# Patient Record
Sex: Female | Born: 1980
Health system: Southern US, Community
[De-identification: ages and names within clinical notes are randomized; demographics above are authoritative.]

## PROBLEM LIST (undated history)

## (undated) DIAGNOSIS — I10 Essential (primary) hypertension: Secondary | ICD-10-CM

## (undated) DIAGNOSIS — E079 Disorder of thyroid, unspecified: Secondary | ICD-10-CM

## (undated) DIAGNOSIS — T7840XA Allergy, unspecified, initial encounter: Secondary | ICD-10-CM

## (undated) DIAGNOSIS — E0590 Thyrotoxicosis, unspecified without thyrotoxic crisis or storm: Secondary | ICD-10-CM

## (undated) DIAGNOSIS — E119 Type 2 diabetes mellitus without complications: Secondary | ICD-10-CM

## (undated) DIAGNOSIS — D649 Anemia, unspecified: Secondary | ICD-10-CM

## (undated) HISTORY — DX: Disorder of thyroid, unspecified: E07.9

## (undated) HISTORY — DX: Anemia, unspecified: D64.9

## (undated) HISTORY — DX: Allergy, unspecified, initial encounter: T78.40XA

## (undated) HISTORY — PX: KNEE SURGERY: SHX244

## (undated) HISTORY — DX: Type 2 diabetes mellitus without complications: E11.9

---

## 2000-06-07 ENCOUNTER — Emergency Department (HOSPITAL_COMMUNITY): Admission: EM | Admit: 2000-06-07 | Discharge: 2000-06-07 | Payer: Self-pay | Admitting: Emergency Medicine

## 2004-03-29 ENCOUNTER — Other Ambulatory Visit: Admission: RE | Admit: 2004-03-29 | Discharge: 2004-03-29 | Payer: Self-pay | Admitting: Family Medicine

## 2004-09-23 ENCOUNTER — Other Ambulatory Visit: Admission: RE | Admit: 2004-09-23 | Discharge: 2004-09-23 | Payer: Self-pay | Admitting: Obstetrics and Gynecology

## 2005-02-10 ENCOUNTER — Other Ambulatory Visit: Admission: RE | Admit: 2005-02-10 | Discharge: 2005-02-10 | Payer: Self-pay | Admitting: Obstetrics and Gynecology

## 2005-07-01 ENCOUNTER — Other Ambulatory Visit: Admission: RE | Admit: 2005-07-01 | Discharge: 2005-07-01 | Payer: Self-pay | Admitting: Obstetrics and Gynecology

## 2007-08-15 ENCOUNTER — Encounter: Admission: RE | Admit: 2007-08-15 | Discharge: 2007-08-15 | Payer: Self-pay | Admitting: Orthopedic Surgery

## 2007-08-28 ENCOUNTER — Encounter (INDEPENDENT_AMBULATORY_CARE_PROVIDER_SITE_OTHER): Payer: Self-pay | Admitting: Orthopedic Surgery

## 2007-08-28 ENCOUNTER — Ambulatory Visit (HOSPITAL_COMMUNITY): Admission: RE | Admit: 2007-08-28 | Discharge: 2007-08-29 | Payer: Self-pay | Admitting: Orthopedic Surgery

## 2008-05-08 LAB — CONVERTED CEMR LAB: Pap Smear: NORMAL

## 2008-05-21 ENCOUNTER — Encounter: Admission: RE | Admit: 2008-05-21 | Discharge: 2008-05-21 | Payer: Self-pay | Admitting: Orthopedic Surgery

## 2009-05-21 ENCOUNTER — Ambulatory Visit: Payer: Self-pay | Admitting: Family Medicine

## 2009-05-21 DIAGNOSIS — B2790 Infectious mononucleosis, unspecified without complication: Secondary | ICD-10-CM | POA: Insufficient documentation

## 2009-05-21 DIAGNOSIS — Z862 Personal history of diseases of the blood and blood-forming organs and certain disorders involving the immune mechanism: Secondary | ICD-10-CM | POA: Insufficient documentation

## 2009-09-12 ENCOUNTER — Encounter: Admission: RE | Admit: 2009-09-12 | Discharge: 2009-09-12 | Payer: Self-pay | Admitting: Orthopedic Surgery

## 2009-10-20 ENCOUNTER — Encounter (INDEPENDENT_AMBULATORY_CARE_PROVIDER_SITE_OTHER): Payer: Self-pay | Admitting: Orthopedic Surgery

## 2009-10-20 ENCOUNTER — Ambulatory Visit (HOSPITAL_COMMUNITY): Admission: RE | Admit: 2009-10-20 | Discharge: 2009-10-21 | Payer: Self-pay | Admitting: Orthopedic Surgery

## 2010-02-28 ENCOUNTER — Encounter: Payer: Self-pay | Admitting: Orthopedic Surgery
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
## 2010-04-22 LAB — BASIC METABOLIC PANEL
CO2: 23 mEq/L (ref 19–32)
GFR calc non Af Amer: >60 mL/min (ref >60)
Glucose, Bld: 100 mg/dL — ABNORMAL HIGH (ref 70–99)
Sodium: 138 mEq/L (ref 135–145)

## 2010-04-22 LAB — CBC
HCT: 32.6 % — ABNORMAL LOW (ref 36.0–46.0)
Hemoglobin: 10.9 g/dL — ABNORMAL LOW (ref 12.0–15.0)
MCV: 73.9 fL — ABNORMAL LOW (ref 78.0–100.0)
RBC: 4.41 MIL/uL (ref 3.87–5.11)

## 2010-04-22 LAB — DIFFERENTIAL
Eosinophils Absolute: 0.1 10*3/uL (ref 0.0–0.7)
Eosinophils Relative: 2 % (ref 0–5)
Monocytes Absolute: 0.5 10*3/uL (ref 0.1–1.0)

## 2010-04-22 LAB — PROTIME-INR: Prothrombin Time: 12.8 seconds (ref 11.6–15.2)

## 2010-04-22 LAB — SURGICAL PCR SCREEN
MRSA, PCR: NEGATIVE
Staphylococcus aureus: NEGATIVE

## 2010-04-22 LAB — TYPE AND SCREEN: Antibody Screen: NEGATIVE

## 2010-06-07 ENCOUNTER — Other Ambulatory Visit: Payer: Self-pay | Admitting: Obstetrics & Gynecology

## 2010-06-22 NOTE — Op Note (Signed)
NAME:  Misty Salazar, GROSECLOSE NO.:  [id number]   MEDICAL RECORD NO.:  [id number]          PATIENT TYPE:  OIB   LOCATION:  5021                         FACILITY:  [facility]   PHYSICIAN:  Burnard Bunting, M.D.    DATE OF BIRTH:  [birth year]   DATE OF PROCEDURE:  08/28/2007  DATE OF DISCHARGE:  08/29/2007                               OPERATIVE REPORT   PREOPERATIVE DIAGNOSIS:  Right knee pigmented villonodular synovitis,  retropatellar space.   POSTOPERATIVE DIAGNOSIS:  Right knee pigmented villonodular synovitis,  retropatellar space.   PROCEDURE:  Right knee pigmented villonodular synovitis removal.   SURGEON:  Burnard Bunting, MD   ASSISTANT:  None.   ANESTHESIA:  General endotracheal.   ESTIMATED BLOOD LOSS:  Minimal.   INDICATIONS:  [name] is a 30 year old female with right knee pain  and a nodular PVNS by MRI scan and she presents now for operative  management after explanation of risks and benefits.   SPECIMENS:  Fat pads with nodular PVNS x1.   PROCEDURE IN DETAIL:  The patient was brought to the operating room  where general endotracheal anesthesia was induced.  Preoperative  antibiotics administered, the right knee and leg was prepped with  DuraPrep solution and draped in sterile manner.  Operative field was  scrubbed with Ioban.  The leg was elevated, exsanguinated with an  Esmarch wrap, and tourniquet was inflated.  Incision was made on the  medial aspect of the patellar tendon, extending proximally.  Skin and  subcutaneous tissue were sharply divided.  A catheter was entered.  Careful dissection was performed to eliminate a plane between the  posterior aspect of the patellar tendon and fat pad.  Fat pad was then  carefully and completely excised from the retropatellar space.  Care was  taken to avoid injury to the transverse meniscal ligament.  An  arthrotomy was also made on the lateral aspect of the patellar tendon to  facilitate full removal.  The  fat pad was removed in block.  The nodular  masses were fully within the fat pad itself.  Knee joint was thoroughly  irrigated.  The menisci were intact.  Following the thorough irrigation,  the tourniquet was released.  Bleeding points encountered were  controlled using electrocautery.  The arthrotomy was then closed using  interrupted inverted 0 Vicryl sutures followed by interrupted inverted 3-  0 Vicryl sutures and running 3-0 Prolene.  The patient tolerated the  procedure without immediate complications.  Solution of Marcaine,  morphine, and clonidine was injected to the knee.  Bulky dressing was  applied.  Specimen was sent to pathology.      Burnard Bunting, M.D.  Electronically Signed     GSD/MEDQ  D:  09/06/2007  T:  09/07/2007  Job:  [id number]

## 2010-11-05 LAB — DIFFERENTIAL
Basophils Absolute: 0
Eosinophils Absolute: 0.1
Lymphocytes Relative: 33
Monocytes Absolute: 0.4
Monocytes Relative: 5
Neutro Abs: 4.2
Neutrophils Relative %: 60

## 2010-11-05 LAB — CBC
Hemoglobin: 11.6 — ABNORMAL LOW
MCHC: 33.5
MCV: 80
Platelets: 278

## 2010-11-05 LAB — BASIC METABOLIC PANEL
Calcium: 9.5
GFR calc Af Amer: >60
GFR calc non Af Amer: >60

## 2010-11-05 LAB — ABO/RH: ABO/RH(D): O POS

## 2010-11-05 LAB — PROTIME-INR: INR: 1

## 2010-11-05 LAB — TYPE AND SCREEN
ABO/RH(D): O POS
Antibody Screen: NEGATIVE

## 2010-11-25 ENCOUNTER — Other Ambulatory Visit: Payer: Self-pay | Admitting: Family Medicine

## 2010-11-25 ENCOUNTER — Ambulatory Visit
Admission: RE | Admit: 2010-11-25 | Discharge: 2010-11-25 | Disposition: A | Discharge status: Home / Self Care | Payer: Medicare HMO | Source: Ambulatory Visit | Attending: Family Medicine | Admitting: Family Medicine

## 2010-11-25 DIAGNOSIS — R1011 Right upper quadrant pain: Secondary | ICD-10-CM

## 2010-12-02 ENCOUNTER — Ambulatory Visit (INDEPENDENT_AMBULATORY_CARE_PROVIDER_SITE_OTHER): Payer: Medicare HMO | Admitting: General Surgery

## 2010-12-02 ENCOUNTER — Encounter (INDEPENDENT_AMBULATORY_CARE_PROVIDER_SITE_OTHER): Payer: Self-pay | Admitting: General Surgery

## 2010-12-02 VITALS — BP 154/98 | HR 72 | Temp 97.8°F | Resp 16 | Ht 62.0 in | Wt 279.4 lb

## 2010-12-02 DIAGNOSIS — K8020 Calculus of gallbladder without cholecystitis without obstruction: Secondary | ICD-10-CM

## 2010-12-02 NOTE — Patient Instructions (Signed)
Call me back tomorrow at [phone]

## 2010-12-02 NOTE — Progress Notes (Signed)
Chief Complaint  Patient presents with  . Other    new pt- eval gallstones    HPI Misty Salazar is a 30 y.o. female.This patient is referred by Dr. Milus Glazier for evaluation of possible cholelithiasis. She was seen approximately 8 days ago in the urgent care for evaluation of right upper quadrant pain which awoke her from sleep. She states that she had pain in the right upper quadrant after eating pizza which lasts a few hours and spontaneously resolved. She describes this as a sharp pain  which was the worst pain that she had felt. She denies any radiation. There is no associated fevers, chills, nausea, or vomiting. She states that she had a similar episode although not as severe a few days prior to her presentation at the urgent care but she has not had any episodes since. She did not have heartburn but she was given Nexium for possible relief as well. She has not noticed any difference but she has not had any symptoms since then. She has been trying to eat a low-fat diet since both of these episodes were brought about by pizza and fried chicken from [employer]. HPI  Past Medical History  Diagnosis Date  . Anemia   . Thyroid disease     hyperthyroidism    Past Surgical History  Procedure Date  . Knee surgery 08/2007, 10/2009    right knee- remove fibroma    Family History  Problem Relation Age of Onset  . Cancer Maternal Grandmother     cervical  . Stroke Maternal Grandmother   . Stroke Maternal Grandfather   . Stroke Paternal Grandfather     Social History History  Substance Use Topics  . Smoking status: Never Smoker   . Smokeless tobacco: Not on file  . Alcohol Use: Yes    Allergies  Allergen Reactions  . Percocet (Oxycodone-Acetaminophen)     Current Outpatient Prescriptions  Medication Sig Dispense Refill  . IRON PO Take by mouth daily.        Colleen Can 1/20 1-20 MG-MCG tablet daily.      . methimazole (TAPAZOLE) 10 MG tablet daily.        Review of  Systems Review of Systems  All other systems reviewed and are negative.    Blood pressure 154/98, pulse 72, temperature 97.8 F (36.6 C), temperature source Temporal, resp. rate 16, height 5\' 2"  (1.575 m), weight 279 lb 6.4 oz (126.735 kg).  Physical Exam Physical Exam  Constitutional: She is oriented to person, place, and time. She appears well-developed and well-nourished. No distress.  HENT:  Head: Normocephalic and atraumatic.  Mouth/Throat: Oropharyngeal exudate present.  Eyes: Conjunctivae and EOM are normal. Pupils are equal, round, and reactive to light. Right eye exhibits no discharge. Left eye exhibits no discharge. No scleral icterus.  Neck: Normal range of motion. Neck supple. No tracheal deviation present.  Cardiovascular: Normal rate, regular rhythm and normal heart sounds.   Pulmonary/Chest: Effort normal and breath sounds normal. No stridor. No respiratory distress. She has no wheezes.  Abdominal: Soft. Bowel sounds are normal. She exhibits no distension and no mass. There is no tenderness. There is no rebound and no guarding.  Musculoskeletal: Normal range of motion. She exhibits no edema and no tenderness.  Neurological: She is alert and oriented to person, place, and time.  Skin: Skin is warm and dry. No rash noted. She is not diaphoretic. No erythema. No pallor.  Psychiatric: She has a normal mood and affect. Her  behavior is normal. Judgment and thought content normal.    Data Reviewed [place] Assessment    Abdominal pain and possible cholelithiasis. I think that her symptoms do sound consistent with gallbladder pathology and possible symptomatic cholelithiasis.However her ultrasound was not very convincing for cholelithiasis. I have placed a call to Dr. Reche Dixon selection reviewed the images with him and confirm cholelithiasis. If she does indeed have gallstones on ultrasound, and given her symptoms I would recommend cholecystectomy for treatment. I discussed with her the  options of continued observation versus surgical management to prevent further episodes of pain. I think that she is leaning towards surgery. We discussed the surgery and its risks including infection, bleeding, pain, scarring, persistent symptoms, injury to bowel or bile ducts, diarrhea, and need for open surgery and she expressed understanding of these risks.     Plan    I have placed a call with Dr. Reche Dixon the radiologist to review her images and if she does have gallstones then we will proceed with cholecystectomy given her symptoms. She will call me tomorrow to followup for discussion and to schedule surgery.       Lodema Pilot DAVID 12/02/2010, 3:23 PM

## 2010-12-06 ENCOUNTER — Other Ambulatory Visit (INDEPENDENT_AMBULATORY_CARE_PROVIDER_SITE_OTHER): Payer: Self-pay | Admitting: General Surgery

## 2010-12-06 DIAGNOSIS — Z711 Person with feared health complaint in whom no diagnosis is made: Secondary | ICD-10-CM

## 2010-12-10 ENCOUNTER — Ambulatory Visit
Admission: RE | Admit: 2010-12-10 | Discharge: 2010-12-10 | Disposition: A | Discharge status: Home / Self Care | Payer: 59 | Source: Ambulatory Visit | Attending: General Surgery | Admitting: General Surgery

## 2010-12-10 DIAGNOSIS — Z711 Person with feared health complaint in whom no diagnosis is made: Secondary | ICD-10-CM

## 2010-12-10 MED ORDER — GADOBENATE DIMEGLUMINE 529 MG/ML IV SOLN
20.0000 mL | Freq: Once | INTRAVENOUS | Status: AC | PRN
  Administered 2010-12-10: 20 mL via INTRAVENOUS

## 2010-12-14 ENCOUNTER — Telehealth (INDEPENDENT_AMBULATORY_CARE_PROVIDER_SITE_OTHER): Payer: Self-pay

## 2010-12-14 ENCOUNTER — Other Ambulatory Visit (INDEPENDENT_AMBULATORY_CARE_PROVIDER_SITE_OTHER): Payer: Self-pay | Admitting: General Surgery

## 2010-12-14 NOTE — Telephone Encounter (Signed)
Called patient to discuss MRI results, patient would like to schedule surgery (Laparoscopic Cholecystectomy) the 1st week of January 2013, preferably on a Wed or Thursday.  Surgical orders given to schedulers, also discussed medications and vitamins to hold prior to surgery, patient aware and understands.

## 2010-12-17 ENCOUNTER — Other Ambulatory Visit (INDEPENDENT_AMBULATORY_CARE_PROVIDER_SITE_OTHER): Payer: Self-pay | Admitting: General Surgery

## 2010-12-23 ENCOUNTER — Other Ambulatory Visit (INDEPENDENT_AMBULATORY_CARE_PROVIDER_SITE_OTHER): Payer: Self-pay

## 2010-12-24 ENCOUNTER — Other Ambulatory Visit (INDEPENDENT_AMBULATORY_CARE_PROVIDER_SITE_OTHER): Payer: Self-pay

## 2011-01-13 ENCOUNTER — Encounter (HOSPITAL_COMMUNITY): Payer: Self-pay | Admitting: Pharmacy Technician

## 2011-01-17 ENCOUNTER — Encounter (HOSPITAL_COMMUNITY)
Admission: RE | Admit: 2011-01-17 | Discharge: 2011-01-17 | Disposition: A | Discharge status: Home / Self Care | Payer: Managed Care, Other (non HMO) | Source: Ambulatory Visit | Attending: General Surgery | Admitting: General Surgery

## 2011-01-17 ENCOUNTER — Encounter (HOSPITAL_COMMUNITY): Payer: Self-pay

## 2011-01-17 HISTORY — DX: Thyrotoxicosis, unspecified without thyrotoxic crisis or storm: E05.90

## 2011-01-17 LAB — COMPREHENSIVE METABOLIC PANEL
AST: 13 U/L (ref 0–37)
BUN: 7 mg/dL (ref 6–23)
CO2: 24 mEq/L (ref 19–32)
Chloride: 103 mEq/L (ref 96–112)
Creatinine, Ser: 0.77 mg/dL (ref 0.50–1.10)
GFR calc Af Amer: >90 mL/min (ref >90)
GFR calc non Af Amer: >90 mL/min (ref >90)
Glucose, Bld: 81 mg/dL (ref 70–99)
Total Bilirubin: 0.2 mg/dL — ABNORMAL LOW (ref 0.3–1.2)

## 2011-01-17 LAB — SURGICAL PCR SCREEN: MRSA, PCR: NEGATIVE

## 2011-01-17 LAB — DIFFERENTIAL
Basophils Absolute: 0 10*3/uL (ref 0.0–0.1)
Basophils Relative: 0 % (ref 0–1)
Monocytes Absolute: 0.5 10*3/uL (ref 0.1–1.0)
Neutro Abs: 5.7 10*3/uL (ref 1.7–7.7)
Neutrophils Relative %: 59 % (ref 43–77)

## 2011-01-17 LAB — CBC
HCT: 34.1 % — ABNORMAL LOW (ref 36.0–46.0)
Hemoglobin: 11.4 g/dL — ABNORMAL LOW (ref 12.0–15.0)
MCH: 25.3 pg — ABNORMAL LOW (ref 26.0–34.0)
MCHC: 33.4 g/dL (ref 30.0–36.0)

## 2011-01-17 LAB — HCG, SERUM, QUALITATIVE: Preg, Serum: NEGATIVE

## 2011-01-17 NOTE — Patient Instructions (Signed)
20 Misty Salazar  01/17/2011   Your procedure is scheduled on:  01/20/11 1200noon-201 pm   Report to [facility] at 1000 AM.  Call this number if you have problems the morning of surgery: [phone]   Remember:   Do not eat food:After Midnight.  May have clear liquids:until Midnight .  Clear liquids include soda, tea, black coffee, apple or grape juice, broth.  Take these medicines the morning of surgery with A SIP OF WATER:    Do not wear jewelry, make-up or nail polish.  Do not wear lotions, powders, or perfumes.  Do not shave 48 hours prior to surgery.  Do not bring valuables to the hospital.  Contacts, dentures or bridgework may not be worn into surgery.      Patients discharged the day of surgery will not be allowed to drive home.  Name and phone number of your driver:   Special Instructions: CHG Shower Use Special Wash: 1/2 bottle night before surgery and 1/2 bottle morning of surgery. Shower chin to toes with CHG. Wash face and private parts with regular soap.    Please read over the following fact sheets that you were given: MRSA Information, coughing and deep breathing exercises, leg exercises

## 2011-01-20 ENCOUNTER — Encounter (HOSPITAL_COMMUNITY): Payer: Self-pay | Admitting: Anesthesiology

## 2011-01-20 ENCOUNTER — Encounter (HOSPITAL_COMMUNITY)
Admission: RE | Disposition: A | Discharge status: Home / Self Care | Payer: Self-pay | Source: Ambulatory Visit | Attending: General Surgery

## 2011-01-20 ENCOUNTER — Ambulatory Visit (HOSPITAL_COMMUNITY): Payer: Managed Care, Other (non HMO) | Admitting: Anesthesiology

## 2011-01-20 ENCOUNTER — Other Ambulatory Visit (INDEPENDENT_AMBULATORY_CARE_PROVIDER_SITE_OTHER): Payer: Self-pay | Admitting: General Surgery

## 2011-01-20 ENCOUNTER — Ambulatory Visit (HOSPITAL_COMMUNITY)
Admission: RE | Admit: 2011-01-20 | Discharge: 2011-01-20 | Disposition: A | Discharge status: Home / Self Care | Payer: Managed Care, Other (non HMO) | Source: Ambulatory Visit | Attending: General Surgery | Admitting: General Surgery

## 2011-01-20 ENCOUNTER — Encounter (HOSPITAL_COMMUNITY): Payer: Self-pay | Admitting: *Deleted

## 2011-01-20 DIAGNOSIS — Z79899 Other long term (current) drug therapy: Secondary | ICD-10-CM | POA: Insufficient documentation

## 2011-01-20 DIAGNOSIS — E0590 Thyrotoxicosis, unspecified without thyrotoxic crisis or storm: Secondary | ICD-10-CM | POA: Insufficient documentation

## 2011-01-20 DIAGNOSIS — Z01812 Encounter for preprocedural laboratory examination: Secondary | ICD-10-CM | POA: Insufficient documentation

## 2011-01-20 DIAGNOSIS — D649 Anemia, unspecified: Secondary | ICD-10-CM | POA: Insufficient documentation

## 2011-01-20 DIAGNOSIS — K8010 Calculus of gallbladder with chronic cholecystitis without obstruction: Secondary | ICD-10-CM

## 2011-01-20 DIAGNOSIS — K8020 Calculus of gallbladder without cholecystitis without obstruction: Secondary | ICD-10-CM | POA: Insufficient documentation

## 2011-01-20 HISTORY — PX: CHOLECYSTECTOMY: SHX55

## 2011-01-20 SURGERY — LAPAROSCOPIC CHOLECYSTECTOMY
Anesthesia: General | Site: Abdomen | Wound class: Contaminated

## 2011-01-20 MED ORDER — BUPIVACAINE-EPINEPHRINE PF 0.25-1:200000 % IJ SOLN
INTRAMUSCULAR | Status: AC
  Filled 2011-01-20: qty 30

## 2011-01-20 MED ORDER — LACTATED RINGERS IV SOLN
INTRAVENOUS | Status: DC | PRN
  Administered 2011-01-20: 1000 mL

## 2011-01-20 MED ORDER — HYDROCODONE-ACETAMINOPHEN 5-325 MG PO TABS
1.0000 | ORAL_TABLET | ORAL | Status: DC | PRN
Start: 2011-01-20 — End: 2011-01-22
  Administered 2011-01-20: 1 via ORAL

## 2011-01-20 MED ORDER — GLYCOPYRROLATE 0.2 MG/ML IJ SOLN
INTRAMUSCULAR | Status: DC | PRN
  Administered 2011-01-20: .7 mg via INTRAVENOUS

## 2011-01-20 MED ORDER — SUCCINYLCHOLINE CHLORIDE 20 MG/ML IJ SOLN
INTRAMUSCULAR | Status: DC | PRN
  Administered 2011-01-20: 100 mg via INTRAVENOUS

## 2011-01-20 MED ORDER — LACTATED RINGERS IV SOLN
INTRAVENOUS | Status: DC
  Administered 2011-01-20: 14:00:00 via INTRAVENOUS
  Administered 2011-01-20: 1000 mL via INTRAVENOUS

## 2011-01-20 MED ORDER — HYDROCODONE-ACETAMINOPHEN 5-500 MG PO TABS: 1.0000 | ORAL_TABLET | Freq: Four times a day (QID) | ORAL | Status: AC | PRN

## 2011-01-20 MED ORDER — CISATRACURIUM BESYLATE 2 MG/ML IV SOLN
INTRAVENOUS | Status: DC | PRN
  Administered 2011-01-20 (×3): 2 mg via INTRAVENOUS
  Administered 2011-01-20: 4 mg via INTRAVENOUS
  Administered 2011-01-20: 6 mg via INTRAVENOUS

## 2011-01-20 MED ORDER — ACETAMINOPHEN 10 MG/ML IV SOLN
INTRAVENOUS | Status: AC
  Filled 2011-01-20: qty 100

## 2011-01-20 MED ORDER — CEFAZOLIN SODIUM-DEXTROSE 2-3 GM-% IV SOLR
2.0000 g | INTRAVENOUS | Status: AC
  Administered 2011-01-20: 2 g via INTRAVENOUS

## 2011-01-20 MED ORDER — LABETALOL HCL 5 MG/ML IV SOLN
INTRAVENOUS | Status: DC | PRN
  Administered 2011-01-20 (×4): 5 mg via INTRAVENOUS

## 2011-01-20 MED ORDER — FENTANYL CITRATE 0.05 MG/ML IJ SOLN
25.0000 ug | INTRAMUSCULAR | Status: DC | PRN
  Administered 2011-01-20: 25 ug via INTRAVENOUS

## 2011-01-20 MED ORDER — PROMETHAZINE HCL 25 MG/ML IJ SOLN: 6.2500 mg | INTRAMUSCULAR | Status: DC | PRN

## 2011-01-20 MED ORDER — MIDAZOLAM HCL 5 MG/5ML IJ SOLN
INTRAMUSCULAR | Status: DC | PRN
  Administered 2011-01-20 (×2): 1 mg via INTRAVENOUS

## 2011-01-20 MED ORDER — ONDANSETRON HCL 4 MG/2ML IJ SOLN
INTRAMUSCULAR | Status: DC | PRN
  Administered 2011-01-20: 4 mg via INTRAVENOUS

## 2011-01-20 MED ORDER — LIDOCAINE HCL (CARDIAC) 20 MG/ML IV SOLN
INTRAVENOUS | Status: DC | PRN
  Administered 2011-01-20: 60 mg via INTRAVENOUS

## 2011-01-20 MED ORDER — HYDROCODONE-ACETAMINOPHEN 5-325 MG PO TABS
ORAL_TABLET | ORAL | Status: AC
  Administered 2011-01-20: 1 via ORAL
  Filled 2011-01-20: qty 1

## 2011-01-20 MED ORDER — NEOSTIGMINE METHYLSULFATE 1 MG/ML IJ SOLN
INTRAMUSCULAR | Status: DC | PRN
  Administered 2011-01-20: 5 mg via INTRAVENOUS

## 2011-01-20 MED ORDER — IOHEXOL 300 MG/ML  SOLN
INTRAMUSCULAR | Status: AC
  Filled 2011-01-20: qty 1

## 2011-01-20 MED ORDER — BUPIVACAINE-EPINEPHRINE 0.25% -1:200000 IJ SOLN
INTRAMUSCULAR | Status: DC | PRN
  Administered 2011-01-20: 30 mL

## 2011-01-20 MED ORDER — PROPOFOL 10 MG/ML IV BOLUS
INTRAVENOUS | Status: DC | PRN
  Administered 2011-01-20: 20 mg via INTRAVENOUS
  Administered 2011-01-20: 180 mg via INTRAVENOUS

## 2011-01-20 MED ORDER — CEFAZOLIN SODIUM-DEXTROSE 2-3 GM-% IV SOLR
INTRAVENOUS | Status: AC
  Filled 2011-01-20: qty 50

## 2011-01-20 MED ORDER — ACETAMINOPHEN 10 MG/ML IV SOLN
INTRAVENOUS | Status: DC | PRN
  Administered 2011-01-20: 1000 mg via INTRAVENOUS

## 2011-01-20 MED ORDER — LACTATED RINGERS IV SOLN: INTRAVENOUS | Status: DC

## 2011-01-20 MED ORDER — FENTANYL CITRATE 0.05 MG/ML IJ SOLN
INTRAMUSCULAR | Status: AC
  Filled 2011-01-20: qty 2

## 2011-01-20 MED ORDER — FENTANYL CITRATE 0.05 MG/ML IJ SOLN
INTRAMUSCULAR | Status: DC | PRN
Start: 2011-01-20 — End: 2011-01-20
  Administered 2011-01-20 (×3): 50 ug via INTRAVENOUS
  Administered 2011-01-20: 150 ug via INTRAVENOUS

## 2011-01-20 SURGICAL SUPPLY — 52 items
ADH SKN CLS APL DERMABOND .7 (GAUZE/BANDAGES/DRESSINGS)
APPLICATOR COTTON TIP 6IN STRL (MISCELLANEOUS) IMPLANT
APPLIER CLIP LOGIC TI 5 (MISCELLANEOUS) IMPLANT
APPLIER CLIP ROT 10 11.4 M/L (STAPLE) ×3
APR CLP MED LRG 11.4X10 (STAPLE) ×2
APR CLP MED LRG 33X5 (MISCELLANEOUS)
BAG SPEC RTRVL LRG 6X4 10 (ENDOMECHANICALS) ×2
CANISTER SUCTION 2500CC (MISCELLANEOUS) ×3 IMPLANT
CHLORAPREP W/TINT 26ML (MISCELLANEOUS) ×3 IMPLANT
CLIP APPLIE ROT 10 11.4 M/L (STAPLE) ×1 IMPLANT
CLOTH BEACON ORANGE TIMEOUT ST (SAFETY) ×3 IMPLANT
COVER MAYO STAND STRL (DRAPES) ×2 IMPLANT
DECANTER SPIKE VIAL GLASS SM (MISCELLANEOUS) ×3 IMPLANT
DERMABOND ADVANCED (GAUZE/BANDAGES/DRESSINGS)
DERMABOND ADVANCED .7 DNX12 (GAUZE/BANDAGES/DRESSINGS) IMPLANT
DRAPE C-ARM 42X72 X-RAY (DRAPES) IMPLANT
DRAPE CAMERA CLOSED 9X96 (DRAPES) IMPLANT
DRAPE LAPAROSCOPIC ABDOMINAL (DRAPES) ×3 IMPLANT
DRAPE WARM FLUID 44X44 (DRAPE) ×1 IMPLANT
ELECT REM PT RETURN 9FT ADLT (ELECTROSURGICAL) ×3
ELECTRODE REM PT RTRN 9FT ADLT (ELECTROSURGICAL) ×2 IMPLANT
ENDOLOOP SUT PDS II  0 18 (SUTURE) ×2
ENDOLOOP SUT PDS II 0 18 (SUTURE) ×2 IMPLANT
GLOVE BIOGEL PI IND STRL 6.5 (GLOVE) ×1 IMPLANT
GLOVE BIOGEL PI IND STRL 7.0 (GLOVE) ×2 IMPLANT
GLOVE BIOGEL PI INDICATOR 6.5 (GLOVE) ×1
GLOVE BIOGEL PI INDICATOR 7.0 (GLOVE) ×1
GLOVE ECLIPSE 7.0 STRL STRAW (GLOVE) ×3 IMPLANT
GLOVE SURG SS PI 7.5 STRL IVOR (GLOVE) ×6 IMPLANT
GOWN STRL NON-REIN LRG LVL3 (GOWN DISPOSABLE) ×7 IMPLANT
GOWN STRL REIN XL XLG (GOWN DISPOSABLE) ×6 IMPLANT
KIT BASIN OR (CUSTOM PROCEDURE TRAY) ×3 IMPLANT
NDL INSUFFLATION 14GA 120MM (NEEDLE) IMPLANT
NEEDLE INSUFFLATION 14GA 120MM (NEEDLE) IMPLANT
NS IRRIG 1000ML POUR BTL (IV SOLUTION) ×3 IMPLANT
POUCH SPECIMEN RETRIEVAL 10MM (ENDOMECHANICALS) ×3 IMPLANT
SCISSORS LAP 5X35 DISP (ENDOMECHANICALS) IMPLANT
SET CHOLANGIOGRAPH MIX (MISCELLANEOUS) IMPLANT
SET IRRIG TUBING LAPAROSCOPIC (IRRIGATION / IRRIGATOR) IMPLANT
SLEEVE ENDOPATH XCEL 5M (ENDOMECHANICALS) ×2 IMPLANT
SLEEVE Z-THREAD 5X100MM (TROCAR) ×6 IMPLANT
STRIP CLOSURE SKIN 1/2X4 (GAUZE/BANDAGES/DRESSINGS) IMPLANT
SUT MNCRL AB 4-0 PS2 18 (SUTURE) ×3 IMPLANT
TOWEL OR 17X26 10 PK STRL BLUE (TOWEL DISPOSABLE) ×3 IMPLANT
TRAY LAP CHOLE (CUSTOM PROCEDURE TRAY) ×3 IMPLANT
TROCAR BALLN 12MMX100 BLUNT (TROCAR) ×3 IMPLANT
TROCAR BLADELESS OPT 5 100 (ENDOMECHANICALS) ×2 IMPLANT
TROCAR XCEL BLUNT TIP 100MML (ENDOMECHANICALS) IMPLANT
TROCAR XCEL NON-BLD 11X100MML (ENDOMECHANICALS) ×2 IMPLANT
TROCAR Z-THREAD FIOS 11X100 BL (TROCAR) ×1 IMPLANT
TROCAR Z-THREAD FIOS 5X100MM (TROCAR) ×1 IMPLANT
TUBING INSUFFLATION 10FT LAP (TUBING) ×3 IMPLANT

## 2011-01-20 NOTE — Transfer of Care (Signed)
Immediate Anesthesia Transfer of Care Note  Patient: Misty Salazar  Procedure(s) Performed:  LAPAROSCOPIC CHOLECYSTECTOMY  Patient Location: PACU  Anesthesia Type: General  Level of Consciousness: sedated, patient cooperative and responds to stimulaton  Airway & Oxygen Therapy: Patient Spontanous Breathing and Patient connected to face mask oxgen  Post-op Assessment: Report given to PACU RN and Post -op Vital signs reviewed and stable  Post vital signs: Reviewed and stable  Complications: No apparent anesthesia complications

## 2011-01-20 NOTE — Anesthesia Procedure Notes (Signed)
Date/Time: 01/20/2011 1:20 PM Performed by: Hulan Fess Pre-anesthesia Checklist: Patient identified, Emergency Drugs available, Suction available, Patient being monitored and Timeout performed Patient Re-evaluated:Patient Re-evaluated prior to inductionOxygen Delivery Method: Circle System Utilized Preoxygenation: Pre-oxygenation with 100% oxygen Intubation Type: IV induction Ventilation: Mask ventilation without difficulty Laryngoscope Size: Mac and 3 Grade View: Grade I Tube type: Oral Tube size: 8.0 mm Number of attempts: 1 Placement Confirmation: ETT inserted through vocal cords under direct vision,  positive ETCO2 and breath sounds checked- equal and bilateral Secured at: 22 cm Tube secured with: Tape Dental Injury: Teeth and Oropharynx as per pre-operative assessment

## 2011-01-20 NOTE — Brief Op Note (Signed)
01/20/2011  4:06 PM  PATIENT:  Misty Salazar  30 y.o. female  PRE-OPERATIVE DIAGNOSIS:  CHOLELITHIASIS  POST-OPERATIVE DIAGNOSIS:  CHOLELITHIASIS  PROCEDURE:  Procedure(s): LAPAROSCOPIC CHOLECYSTECTOMY  SURGEON:  Surgeon(s): Rulon Abide, DO  PHYSICIAN ASSISTANT:   ASSISTANTS: none, intraoperative consult by Dr. Andrey Campanile  Findings:  Giant liver and thick abdominal wall limiting ability to retract gallbladder, gallbladder dissected dome down and GB hanging on cystic duct and endolooped x2.  Multiple gallstones and large stone extracted from cystic duct. Unable to perform cholangiogram  ANESTHESIA:   general  EBL:  Total I/O In: 1000 [I.V.:1000] Out: -   BLOOD ADMINISTERED:none  DRAINS: none   LOCAL MEDICATIONS USED:  MARCAINE 15CC and LIDOCAINE 15CC  SPECIMEN:  Source of Specimen:  gallbladder and contents  DISPOSITION OF SPECIMEN:  PATHOLOGY  COUNTS:  YES  TOURNIQUET:  * No tourniquets in log *  DICTATION: .Other Dictation: Dictation Number [id number]   PLAN OF CARE: Discharge to home after PACU  PATIENT DISPOSITION:  PACU - hemodynamically stable.   Delay start of Pharmacological VTE agent (>24hrs) due to surgical blood loss or risk of bleeding:  {YES/NO/NOT APPLICABLE:20182

## 2011-01-20 NOTE — Anesthesia Postprocedure Evaluation (Signed)
  Anesthesia Post-op Note  Patient: Misty Salazar  Procedure(s) Performed:  LAPAROSCOPIC CHOLECYSTECTOMY  Patient Location: PACU  Anesthesia Type: General  Level of Consciousness: awake and alert   Airway and Oxygen Therapy: Patient Spontanous Breathing  Post-op Pain: mild  Post-op Assessment: Post-op Vital signs reviewed, Patient's Cardiovascular Status Stable, Respiratory Function Stable, Patent Airway and No signs of Nausea or vomiting  Post-op Vital Signs: stable  Complications: No apparent anesthesia complications

## 2011-01-20 NOTE — Anesthesia Preprocedure Evaluation (Addendum)
Anesthesia Evaluation  Patient identified by MRN, date of birth, ID band Patient awake    Reviewed: Allergy & Precautions, H&P , NPO status , Patient's Chart, lab work & pertinent test results  Airway Mallampati: I TM Distance: >3 FB Neck ROM: full    Dental No notable dental hx. (+) Teeth Intact and Dental Advisory Given   Pulmonary neg pulmonary ROS,  clear to auscultation  Pulmonary exam normal       Cardiovascular Exercise Tolerance: Good neg cardio ROS regular Normal    Neuro/Psych Negative Neurological ROS  Negative Psych ROS   GI/Hepatic negative GI ROS, Neg liver ROS,   Endo/Other  Negative Endocrine ROSHyperthyroidism Treated hyperthyroidism with no symptoms.  Renal/GU negative Renal ROS  Genitourinary negative   Musculoskeletal   Abdominal   Peds  Hematology negative hematology ROS (+)   Anesthesia Other Findings   Reproductive/Obstetrics negative OB ROS                         Anesthesia Physical Anesthesia Plan  ASA: II  Anesthesia Plan: General   Post-op Pain Management:    Induction: Intravenous  Airway Management Planned: Oral ETT  Additional Equipment:   Intra-op Plan:   Post-operative Plan: Extubation in OR  Informed Consent: I have reviewed the patients History and Physical, chart, labs and discussed the procedure including the risks, benefits and alternatives for the proposed anesthesia with the patient or authorized representative who has indicated his/her understanding and acceptance.   Dental Advisory Given  Plan Discussed with: CRNA and Surgeon  Anesthesia Plan Comments:         Anesthesia Quick Evaluation

## 2011-01-20 NOTE — Anesthesia Postprocedure Evaluation (Signed)
  Anesthesia Post-op Note  Patient: Misty Salazar  Procedure(s) Performed:  LAPAROSCOPIC CHOLECYSTECTOMY  Patient Location: PACU  Anesthesia Type: General  Level of Consciousness: oriented and sedated  Airway and Oxygen Therapy: Patient Spontanous Breathing and Patient connected to nasal cannula oxygen  Post-op Pain: mild  Post-op Assessment: Post-op Vital signs reviewed, Patient's Cardiovascular Status Stable, Respiratory Function Stable and Patent Airway  Post-op Vital Signs: stable  Complications: No apparent anesthesia complications

## 2011-01-20 NOTE — Progress Notes (Signed)
Pt up and ambulated in hall to BR.  Pt tolerated well.  Pt voided without difficulty.  Pt has no complaints except soreness.

## 2011-01-20 NOTE — H&P (Signed)
HPI  Misty Salazar is a 30 y.o. female.This patient is referred by Dr. Milus Glazier for evaluation of possible cholelithiasis. She was seen approximately 8 days ago in the urgent care for evaluation of right upper quadrant pain which awoke her from sleep. She states that she had pain in the right upper quadrant after eating pizza which lasts a few hours and spontaneously resolved. She describes this as a sharp pain which was the worst pain that she had felt. She denies any radiation. There is no associated fevers, chills, nausea, or vomiting. She states that she had a similar episode although not as severe a few days prior to her presentation at the urgent care but she has not had any episodes since. She did not have heartburn but she was given Nexium for possible relief as well. She has not noticed any difference but she has not had any symptoms since then. She has been trying to eat a low-fat diet since both of these episodes were brought about by pizza and fried chicken from [employer].  HPI  Past Medical History   Diagnosis  Date   .  Anemia    .  Thyroid disease      hyperthyroidism    Past Surgical History   Procedure  Date   .  Knee surgery  08/2007, 10/2009     right knee- remove fibroma    Family History   Problem  Relation  Age of Onset   .  Cancer  Maternal Grandmother       cervical    .  Stroke  Maternal Grandmother    .  Stroke  Maternal Grandfather    .  Stroke  Paternal Grandfather     Social History  History   Substance Use Topics   .  Smoking status:  Never Smoker   .  Smokeless tobacco:  Not on file   .  Alcohol Use:  Yes    Allergies   Allergen  Reactions   .  Percocet (Oxycodone-Acetaminophen)     Current Outpatient Prescriptions   Medication  Sig  Dispense  Refill   .  IRON PO  Take by mouth daily.     Colleen Can 1/20 1-20 MG-MCG tablet  daily.     .  methimazole (TAPAZOLE) 10 MG tablet  daily.      Review of Systems  Review of Systems  All other  systems reviewed and are negative.    Physical Exam  Physical Exam  Constitutional: She is oriented to person, place, and time. She appears well-developed and well-nourished. No distress.  HENT:  Head: Normocephalic and atraumatic.  Mouth/Throat: Oropharyngeal exudate present.  Eyes: Conjunctivae and EOM are normal. Pupils are equal, round, and reactive to light. Right eye exhibits no discharge. Left eye exhibits no discharge. No scleral icterus.  Neck: Normal range of motion. Neck supple. No tracheal deviation present.  Cardiovascular: Normal rate, regular rhythm and normal heart sounds.  Pulmonary/Chest: Effort normal and breath sounds normal. No stridor. No respiratory distress. She has no wheezes.  Abdominal: Soft. Bowel sounds are normal. She exhibits no distension and no mass. There is no tenderness. There is no rebound and no guarding.  Musculoskeletal: Normal range of motion. She exhibits no edema and no tenderness.  Neurological: She is alert and oriented to person, place, and time.  Skin: Skin is warm and dry. No rash noted. She is not diaphoretic. No erythema. No pallor.  Psychiatric: She has a normal  mood and affect. Her behavior is normal. Judgment and thought content normal.   Data Reviewed  US/MRI/Labs Assessment   Cholelithiasis.  SHe has not been symptomatic since our last visit but MRI confirmed multiple small gallstones.  And she would like to proceed with cholecystectomy to prevent future episodes.  I again discussed the risks of infection, bleeding, pain, persistent symptoms, scarring, injury to bowel or bile ducts, retained stone, diarrhea, need for additional procedures, and need for open surgery.

## 2011-01-20 NOTE — Op Note (Signed)
NAME:  Misty Salazar, ALKHATIB NO.:  [id number]  MEDICAL RECORD NO.:  [id number]  LOCATION:  [facility]                         FACILITY:  [facility]  PHYSICIAN:  Lodema Pilot, MD       DATE OF BIRTH:  23-Nov-1980  DATE OF PROCEDURE:  01/20/2011 DATE OF DISCHARGE:                              OPERATIVE REPORT   PROCEDURE:  Laparoscopic cholecystectomy.  PREOPERATIVE DIAGNOSIS:  Symptomatic cholelithiasis.  POSTOPERATIVE DIAGNOSIS:  Symptomatic cholelithiasis.  SURGEON:  Lodema Pilot, MD  ASSISTANT:  None.  ANESTHESIA:  General endotracheal anesthesia with 30 cc of 1% lidocaine with epinephrine and 0.25% Marcaine in a 50/50 mixture.  SPECIMENS:  Gallbladder and contents sent to pathology for permanent section.  DRAINS:  None.  COMPLICATIONS:  None apparent.  FINDINGS:  Joined liver with rounded edges and very heavy liver, which was unable to be retracted as cephalad.  Made dissection of the gallbladder.  Extremely difficult as well as the thickness of the abdominal wall, limited mobility, and also increased difficulty of the procedure.  She had multiple gallstones and a very large cystic duct, which was too large to accommodate clip placement and the cystic duct had to be Endoloop with a 0 PDS Endoloop.  INDICATION OF PROCEDURE:  Ms. Misty Salazar is a 30 year old female with symptomatic cholelithiasis and an MRI consistent with gallbladder filled with multiple small gallstones.  LFTs were normal.  OPERATIVE DETAILS:  Ms. Misty Salazar was seen and evaluated in the preop area and risks and benefits of procedure were again discussed in lay terms. Informed consent was obtained.  Prophylactic antibiotics were given, and she was taken to the operating room, placed on table in supine position. General endotracheal anesthesia was obtained and her abdomen was prepped and draped in a standard surgical fashion.  Procedure time-out was performed with all operative team members to confirm  proper patient and procedure, and a supraumbilical midline incision was made in the skin and dissection carried down to the abdominal wall fascia using blunt dissection.  The fascia was elevated and sharply incised and the peritoneum entered bluntly.  A 12 mm balloon trocar was placed at the umbilicus and pneumoperitoneum was obtained.  Laparoscope was introduced and there was no evidence of bowel injury upon entry.  She had a very large liver with thick rounded edges and the umbilical port was closed to the liver edge after we had elevated the patient to gallbladder positioning two 5 mm right upper quadrant right lateral abdominal trocars were placed under direct visualization and an 11 mm epigastric trocar was placed under direct visualization.  The movement of the trocars was difficult due to the thickness of the abdominal wall, torquing heavily on the instruments and limiting mobility.  Also, the gallbladder fundus was elevated and I grasped and we attempted to elevate the gallbladder, but the liver was too large and heavy and would not elevate a cephalad, also limiting the retraction.  Fortunately, her anatomy appeared normal and there was no inflammation.  The peritoneum was taken down on the gallbladder and she had a single cystic artery, which was coursing on the gallbladder and this was skeletonized and divided between hemoclips.  The cystic  duct was skeletonized using blunt dissection and a window was created through the triangle of Calot. However, in the critical view of safety was obtained, however, the cystic duct was too thick to accommodate clip placement and then she had obvious stones in the cystic duct and did not think that stapler would be wise for stapling over the stones.  I did not perform a cholangiogram because of the large cystic duct and I think that the cystic duct was not necessarily large as we were high on the gallbladder with our dissection, however, we  could not sufficient retraction and visualization laparoscopically to dissect further down, and so we continued to remove the gallbladder from the gallbladder fossa using Bovie electrocautery and essentially haptic gallbladder dissected in a dome down fashion.  The gallbladder was still connected to the cystic duct and suspended only from the cystic duct.  However, once the gallbladder was removed from the gallbladder fossa, we lost our a little retraction we could get from the liver.  A 5 mm flexible snake liver retractor was placed through the lateral trocar and allowed this to retract the gallbladder cephalad.  At this time, I consulted my partner Dr. Andrey Campanile to get additional suggestion on the best way to manage the ligation of the cystic duct given the stones present at the cystic duct. We felt to be best to place Endoloop around the gallbladder to prevent leakage of stones from the gallbladder and then we could divide the cystic duct, milk back any gallstones that were present in the cystic duct, and then placed another Endoloop on the cystic duct and this was exactly what I did, 0 PDS Endoloop was passed from the gallbladder near the base and then the cystic duct was transected with Endo Shears and the bowel was milked retrograde and a moderate-sized gallstone was milked back out of the cystic duct and removed from the abdomen.  Then, another 0 PDS Endoloop was placed on the cystic duct and the cystic duct was still too large to accommodate clip placement for adequate duct closure.  The Endoloop was placed and reposition and appeared to adequately close the cystic duct.  The gallbladder fossa was inspected for hemostasis, which was noted to be adequate.  There was no evidence of bile leakage.  The gallbladder was removed from the umbilical trocar site in an EndoCatch bag, although the supraumbilical fascia incision had to be enlarged to accommodate removal of the gallbladder and  stones. She had a gallbladder packed full of small gallstones.  Then, the supraumbilical fascia was approximated with interrupted 0 Vicryl sutures in an open fashion and the abdomen was reinsufflated through the epigastric trocar site.  The right upper quadrant appeared hemostatic and there was no evidence of bowel injury.  A two 5 mm right-sided trocars were removed under direct visualization of the abdominal wall was noted to be hemostatic.  The supraumbilical fascial closure was noted to be adequate and there was no evidence of bowel injury.  These sutures were secured and the epigastric trocar was removed and the skin was anesthetized with 30 cc of 1% lidocaine with epinephrine and 0.25% Marcaine in a 50/50 mixture.  The skin edges were approximated with 4-0 Monocryl subcuticular suture and the skin was washed, dried, and Dermabond was applied.  All sponge, needle, instrument counts were correct in the case.  The patient tolerated procedure well without apparent complication.          ______________________________ Lodema Pilot, MD  BL/MEDQ  D:  01/20/2011  T:  01/20/2011  Job:  [id number]

## 2011-01-25 ENCOUNTER — Encounter (HOSPITAL_COMMUNITY): Payer: Self-pay | Admitting: General Surgery

## 2011-02-16 ENCOUNTER — Ambulatory Visit (INDEPENDENT_AMBULATORY_CARE_PROVIDER_SITE_OTHER): Payer: Managed Care, Other (non HMO) | Admitting: General Surgery

## 2011-02-16 ENCOUNTER — Encounter (INDEPENDENT_AMBULATORY_CARE_PROVIDER_SITE_OTHER): Payer: Self-pay | Admitting: General Surgery

## 2011-02-16 VITALS — BP 140/88 | HR 88 | Temp 97.6°F | Resp 20 | Ht 62.0 in | Wt 281.0 lb

## 2011-02-16 DIAGNOSIS — Z5189 Encounter for other specified aftercare: Secondary | ICD-10-CM

## 2011-02-16 DIAGNOSIS — Z4889 Encounter for other specified surgical aftercare: Secondary | ICD-10-CM

## 2011-02-16 NOTE — Progress Notes (Signed)
Subjective:     Patient ID: Misty Salazar, female   DOB: 13-Feb-1980, 31 y.o.   MRN: [id number]  HPI Patient follows up 3 weeks status post laparoscopic cholecystectomy for symptomatic cholelithiasis. She's been doing well and has no complaints. She denies any pain or nausea or fevers. The she did have an episode of diarrhea after eating at [employer] but otherwise been doing well. Her pathology was benign.  Review of Systems     Objective:   Physical Exam distress and nontoxic-appearing  Her abdomen is soft and nontender exam her incisions are healing well without sign of infection. There is no evidence of hernia.    Assessment:     Status post appendectomy cholecystectomy-doing well    Plan:     She is doing well without any signs of postoperative complications. I think that her diarrhea should improve with time and this is not bothering her. She can follow up with [place] in appearance basis and increase activity as tolerated.

## 2011-08-25 ENCOUNTER — Ambulatory Visit (INDEPENDENT_AMBULATORY_CARE_PROVIDER_SITE_OTHER): Payer: Managed Care, Other (non HMO) | Admitting: Family Medicine

## 2011-08-25 DIAGNOSIS — Z23 Encounter for immunization: Secondary | ICD-10-CM

## 2011-08-25 DIAGNOSIS — Z299 Encounter for prophylactic measures, unspecified: Secondary | ICD-10-CM

## 2011-09-06 ENCOUNTER — Telehealth: Payer: Self-pay | Admitting: Family Medicine

## 2011-09-06 NOTE — Telephone Encounter (Signed)
Pt will be attending school in the fall and needs a record of her injections, pt has not have a record and is requesting a titers. Can this be ordered for her?

## 2011-09-07 ENCOUNTER — Other Ambulatory Visit (INDEPENDENT_AMBULATORY_CARE_PROVIDER_SITE_OTHER): Payer: Managed Care, Other (non HMO)

## 2011-09-07 DIAGNOSIS — Z139 Encounter for screening, unspecified: Secondary | ICD-10-CM

## 2011-09-07 NOTE — Telephone Encounter (Signed)
Informed pt she would need OV to discuss immunization titers needed, bring form with immunizations needed, etc.

## 2011-09-07 NOTE — Addendum Note (Signed)
Addended by: Rita Ohara R on: 09/07/2011 04:15 PM   Modules accepted: Orders

## 2011-09-08 LAB — MEASLES/MUMPS/RUBELLA IMMUNITY: Rubeola IgG: 3.41 {ISR} — ABNORMAL HIGH

## 2011-09-08 LAB — HEPATITIS B SURFACE ANTIBODY, QUANTITATIVE: Hepatitis B-Post: 0 m[IU]/mL

## 2011-09-08 NOTE — Progress Notes (Signed)
Quick Note:  Will mail results to pt home ______

## 2011-09-13 ENCOUNTER — Telehealth: Payer: Self-pay | Admitting: Family Medicine

## 2011-09-13 NOTE — Telephone Encounter (Signed)
Pt informed labs were mailed to her home 09/08/11

## 2011-09-13 NOTE — Telephone Encounter (Signed)
Pt called req to get lab results. Pls call.  °

## 2011-09-14 ENCOUNTER — Telehealth: Payer: Self-pay | Admitting: Family Medicine

## 2011-09-14 LAB — POLIOVIRUS ANTIBODIES, TYPES 1, 2, AND 3
Poliovirus Type 1 Antibodies: 1:128 {titer}
Poliovirus Type 2 Antibodies: 1:128 {titer}
Poliovirus Type 3 Antibodies: 1:32 {titer}

## 2011-09-14 NOTE — Telephone Encounter (Signed)
Pt called and said that she still hasn't rcvd the lab results that were mailed out to her on 09/08/11. Pls resend asap.

## 2011-09-14 NOTE — Telephone Encounter (Signed)
This was done yesterday.  

## 2011-09-16 ENCOUNTER — Ambulatory Visit (INDEPENDENT_AMBULATORY_CARE_PROVIDER_SITE_OTHER): Payer: Managed Care, Other (non HMO) | Admitting: Family Medicine

## 2011-09-16 DIAGNOSIS — Z23 Encounter for immunization: Secondary | ICD-10-CM

## 2011-10-18 ENCOUNTER — Ambulatory Visit (INDEPENDENT_AMBULATORY_CARE_PROVIDER_SITE_OTHER): Payer: Managed Care, Other (non HMO) | Admitting: Family Medicine

## 2011-10-18 DIAGNOSIS — Z23 Encounter for immunization: Secondary | ICD-10-CM

## 2011-10-18 DIAGNOSIS — Z299 Encounter for prophylactic measures, unspecified: Secondary | ICD-10-CM

## 2011-11-21 ENCOUNTER — Telehealth: Payer: Self-pay | Admitting: Family Medicine

## 2011-11-21 NOTE — Telephone Encounter (Signed)
Caller: Gilberte/Patient; Patient Name: Misty Salazar; PCP: Evelena Peat [facility]); Best Callback Phone Number: [phone]  onset 11-15-11 of some chest congestion and a cough which is mostly dry sounding.  No fever.  No head congestion  all emergent sxs per Upper Respiratory Infection protocols Ruled out  Home care advice given

## 2011-11-21 NOTE — Telephone Encounter (Signed)
noted 

## 2012-02-16 ENCOUNTER — Ambulatory Visit (INDEPENDENT_AMBULATORY_CARE_PROVIDER_SITE_OTHER): Payer: Managed Care, Other (non HMO) | Admitting: Family Medicine

## 2012-02-16 ENCOUNTER — Encounter: Payer: Self-pay | Admitting: Family Medicine

## 2012-02-16 VITALS — BP 140/90 | Temp 98.7°F | Wt 295.0 lb

## 2012-02-16 DIAGNOSIS — J019 Acute sinusitis, unspecified: Secondary | ICD-10-CM

## 2012-02-16 MED ORDER — AMOXICILLIN 875 MG PO TABS: 875.0000 mg | ORAL_TABLET | Freq: Two times a day (BID) | ORAL | Status: DC

## 2012-02-16 NOTE — Patient Instructions (Addendum)

## 2012-02-16 NOTE — Progress Notes (Signed)
  Subjective:    Patient ID: Misty Salazar, female    DOB: 08-27-1980, 32 y.o.   MRN: [id number]  HPI Acute visit. Three-week history of nasal congestion and cough. She initially had some wheezing but has not noted any recently. No headaches. She's had some thick yellow nasal mucus tinged with blood and productive coughing. Possible low-grade fever initially but none past few days. Increased malaise. She tried saline nasal irrigation without much improvement. She is followed by endocrinologist for hyperthyroidism treated medically and also takes low-dose phentermine.   Review of Systems  Constitutional: Positive for fatigue. Negative for fever and chills.  HENT: Positive for congestion and sinus pressure.   Respiratory: Positive for cough.   Neurological: Negative for headaches.       Objective:   Physical Exam  Constitutional: She appears well-developed and well-nourished.  HENT:  Right Ear: External ear normal.  Left Ear: External ear normal.  Mouth/Throat: Oropharynx is clear and moist.  Neck: Neck supple.  Cardiovascular: Normal rate and regular rhythm.   Pulmonary/Chest: Effort normal and breath sounds normal. No respiratory distress. She has no wheezes. She has no rales.  Lymphadenopathy:    She has no cervical adenopathy.          Assessment & Plan:  Acute sinusitis. Given duration of symptoms, start amoxicillin 875 mg twice daily for 10 days. Avoid Sudafed with her current use of phentermine.

## 2012-03-19 ENCOUNTER — Ambulatory Visit: Payer: Managed Care, Other (non HMO) | Admitting: *Deleted

## 2012-03-23 ENCOUNTER — Ambulatory Visit: Payer: Self-pay | Admitting: Family Medicine

## 2012-03-30 ENCOUNTER — Ambulatory Visit (INDEPENDENT_AMBULATORY_CARE_PROVIDER_SITE_OTHER): Payer: Managed Care, Other (non HMO) | Admitting: Family Medicine

## 2012-03-30 DIAGNOSIS — Z23 Encounter for immunization: Secondary | ICD-10-CM

## 2014-10-09 LAB — HM PAP SMEAR: HM Pap smear: NORMAL

## 2014-11-12 ENCOUNTER — Ambulatory Visit (INDEPENDENT_AMBULATORY_CARE_PROVIDER_SITE_OTHER): Payer: Managed Care, Other (non HMO)

## 2014-11-12 ENCOUNTER — Ambulatory Visit (INDEPENDENT_AMBULATORY_CARE_PROVIDER_SITE_OTHER): Payer: Managed Care, Other (non HMO) | Admitting: Family Medicine

## 2014-11-12 VITALS — BP 130/80 | HR 90 | Temp 99.2°F | Resp 16 | Ht 62.0 in | Wt 311.0 lb

## 2014-11-12 DIAGNOSIS — J069 Acute upper respiratory infection, unspecified: Secondary | ICD-10-CM

## 2014-11-12 DIAGNOSIS — R05 Cough: Secondary | ICD-10-CM

## 2014-11-12 DIAGNOSIS — R059 Cough, unspecified: Secondary | ICD-10-CM

## 2014-11-12 MED ORDER — BENZONATATE 100 MG PO CAPS: 200.0000 mg | ORAL_CAPSULE | Freq: Two times a day (BID) | ORAL | Status: DC | PRN

## 2014-11-12 NOTE — Progress Notes (Signed)
Chief Complaint:  Chief Complaint  Patient presents with  . Cough    x 3 weeks   . tightness in chest    pt. states after coughing a lot, comes and goes     HPI: Misty Salazar is a 34 y.o. female who reports to [facility] today complaining of  Cough that won't go away, she was sick at the end of Speteme[place], she took Astronomer and thersflu, felt better. She has had a dry cough  But intermittently some production. No asthma. She has allergies. She has tried otc meds without releif. No fevers or chills, no leg swelling, no CP or  Palpitations or SOB, she has some minimal CP with coughing spells  Past Medical History  Diagnosis Date  . Anemia   . Thyroid disease     hyperthyroidism  . Hyperthyroidism   . Allergy    Past Surgical History  Procedure Laterality Date  . Knee surgery  08/2007, 10/2009    right knee- remove fibroma  . Cholecystectomy  01/20/2011    Procedure: LAPAROSCOPIC CHOLECYSTECTOMY;  Surgeon: Judieth Keens, DO;  Location: [facility];  Service: General;  Laterality: N/A;   Social History   Social History  . Marital Status: Single    Spouse Name: N/A  . Number of Children: N/A  . Years of Education: N/A   Social History Main Topics  . Smoking status: Never Smoker   . Smokeless tobacco: Never Used  . Alcohol Use: 0.0 oz/week    0 Standard drinks or equivalent per week     Comment: occasional   . Drug Use: No  . Sexual Activity: Not Asked   Other Topics Concern  . None   Social History Narrative   Family History  Problem Relation Age of Onset  . Cancer Maternal Grandmother     cervical  . Stroke Maternal Grandmother   . Stroke Maternal Grandfather   . Stroke Paternal Grandfather    Allergies  Allergen Reactions  . Percocet [Oxycodone-Acetaminophen] Hives and Itching   Prior to Admission medications   Medication Sig Start Date End Date Taking? Authorizing Provider  cetirizine (ZYRTEC) 10 MG tablet Take 10 mg by mouth daily as needed.  Allergies    Yes Historical Provider, MD  IRON PO Take 1 tablet by mouth daily.    Yes Historical Provider, MD  JUNEL 1/20 1-20 MG-MCG tablet Take 1 tablet by mouth daily.  11/01/10  Yes Historical Provider, MD  Lorcaserin HCl (BELVIQ) 10 MG TABS Take by mouth.   Yes Historical Provider, MD  methimazole (TAPAZOLE) 10 MG tablet Take 10 mg by mouth every morning.  11/15/10  Yes Historical Provider, MD     ROS: The patient denies fevers, chills, night sweats, unintentional weight loss, palpitations, wheezing, dyspnea on exertion, nausea, vomiting, abdominal pain, dysuria, hematuria, melena, numbness, weakness, or tingling.   All other systems have been reviewed and were otherwise negative with the exception of those mentioned in the HPI and as above.    PHYSICAL EXAM: Filed Vitals:   11/12/14 1556  BP: 130/80  Pulse: 90  Temp: 99.2 F (37.3 C)  Resp: 16   Body mass index is 56.87 kg/(m^2).   General: Alert, no acute distress, morbidly obese HEENT:  Normocephalic, atraumatic, oropharynx patent. EOMI, PERRLA, no exudates, TM normal , + PND Cardiovascular:  Regular rate and rhythm, no rubs murmurs or gallops.  No Carotid bruits, radial pulse intact. No pedal edema.  Respiratory: Clear  to auscultation bilaterally.  No wheezes, rales, or rhonchi.  No cyanosis, no use of accessory musculature Abdominal: No organomegaly, abdomen is soft and non-tender, positive bowel sounds. No masses. Skin: No rashes. Neurologic: Facial musculature symmetric. Psychiatric: Patient acts appropriately throughout our interaction. Lymphatic: No cervical or submandibular lymphadenopathy Musculoskeletal: Gait intact. No edema, tenderness   LABS:    EKG/XRAY:   Primary read interpreted by Dr. Marin Comment at [facility] No acute cardiopulmonary process   ASSESSMENT/PLAN: Encounter Diagnoses  Name Primary?  . Cough Yes  . Acute URI    Likely post viral cough, but her sister in law just died from colon cancer and she  is afraid that she may be missing something  Tessalon , nasacort Cepachol Fu prn with official radiology report  Gross sideeffects, risk and benefits, and alternatives of medications d/w patient. Patient is aware that all medications have potential sideeffects and we are unable to predict every sideeffect or drug-drug interaction that may occur.  Thao Le DO  11/12/2014 6:06 PM

## 2014-11-13 ENCOUNTER — Ambulatory Visit: Payer: Managed Care, Other (non HMO) | Admitting: Family Medicine

## 2014-11-17 ENCOUNTER — Telehealth: Payer: Self-pay | Admitting: Radiology

## 2014-11-17 NOTE — Telephone Encounter (Signed)
The patient called to get her CXR results, and I informed of normal Chest X Ray.

## 2014-11-19 ENCOUNTER — Ambulatory Visit (INDEPENDENT_AMBULATORY_CARE_PROVIDER_SITE_OTHER): Payer: Managed Care, Other (non HMO) | Admitting: Family Medicine

## 2014-11-19 VITALS — BP 142/80 | HR 93 | Temp 98.9°F | Resp 16 | Ht 62.0 in | Wt 305.0 lb

## 2014-11-19 DIAGNOSIS — R05 Cough: Secondary | ICD-10-CM

## 2014-11-19 DIAGNOSIS — R059 Cough, unspecified: Secondary | ICD-10-CM

## 2014-11-19 DIAGNOSIS — J069 Acute upper respiratory infection, unspecified: Secondary | ICD-10-CM | POA: Diagnosis not present

## 2014-11-19 DIAGNOSIS — J029 Acute pharyngitis, unspecified: Secondary | ICD-10-CM

## 2014-11-19 LAB — POCT RAPID STREP A (OFFICE): Rapid Strep A Screen: NEGATIVE

## 2014-11-19 MED ORDER — AMOXICILLIN 500 MG PO TABS: 500.0000 mg | ORAL_TABLET | Freq: Three times a day (TID) | ORAL | Status: DC

## 2014-11-19 MED ORDER — MAGIC MOUTHWASH W/LIDOCAINE: 5.0000 mL | Freq: Four times a day (QID) | ORAL | Status: DC | PRN

## 2014-11-19 MED ORDER — HYDROCODONE-HOMATROPINE 5-1.5 MG/5ML PO SYRP: 5.0000 mL | ORAL_SOLUTION | Freq: Every evening | ORAL | Status: DC | PRN

## 2014-11-19 NOTE — Progress Notes (Signed)
Chief Complaint:  Chief Complaint  Patient presents with  . Cough    x 3 days  . Sore Throat  . lack of appetite    HPI: Misty Salazar is a 34 y.o. female who reports to [facility] today complaining of sore throat with spots on it, she ahs allergies, she had subjective feers and has tried otc meds and also rx meds She had a normal chest xray before on 10/5. She has throat pain. She has not taken any flonase or allergy NS or allergy meds except what she was previously on.   Past Medical History  Diagnosis Date  . Anemia   . Thyroid disease     hyperthyroidism  . Hyperthyroidism   . Allergy    Past Surgical History  Procedure Laterality Date  . Knee surgery  08/2007, 10/2009    right knee- remove fibroma  . Cholecystectomy  01/20/2011    Procedure: LAPAROSCOPIC CHOLECYSTECTOMY;  Surgeon: Judieth Keens, DO;  Location: [facility];  Service: General;  Laterality: N/A;   Social History   Social History  . Marital Status: Single    Spouse Name: N/A  . Number of Children: N/A  . Years of Education: N/A   Social History Main Topics  . Smoking status: Never Smoker   . Smokeless tobacco: Never Used  . Alcohol Use: 0.0 oz/week    0 Standard drinks or equivalent per week     Comment: occasional   . Drug Use: No  . Sexual Activity: Not Asked   Other Topics Concern  . None   Social History Narrative   Family History  Problem Relation Age of Onset  . Cancer Maternal Grandmother     cervical  . Stroke Maternal Grandmother   . Stroke Maternal Grandfather   . Stroke Paternal Grandfather    Allergies  Allergen Reactions  . Percocet [Oxycodone-Acetaminophen] Hives and Itching   Prior to Admission medications   Medication Sig Start Date End Date Taking? Authorizing Provider  benzonatate (TESSALON) 100 MG capsule Take 2 capsules (200 mg total) by mouth 2 (two) times daily as needed. 11/12/14  Yes Thao P Le, DO  cetirizine (ZYRTEC) 10 MG tablet Take 10 mg by mouth daily  as needed. Allergies    Yes Historical Provider, MD  IRON PO Take 1 tablet by mouth daily.    Yes Historical Provider, MD  JUNEL 1/20 1-20 MG-MCG tablet Take 1 tablet by mouth daily.  11/01/10  Yes Historical Provider, MD  Lorcaserin HCl (BELVIQ) 10 MG TABS Take by mouth.   Yes Historical Provider, MD  methimazole (TAPAZOLE) 10 MG tablet Take 10 mg by mouth every morning.  11/15/10  Yes Historical Provider, MD     ROS: The patient denies fevers, chills, night sweats, unintentional weight loss, chest pain, palpitations, wheezing, dyspnea on exertion, nausea, vomiting, abdominal pain, dysuria, hematuria, melena, numbness, weakness, or tingling.   All other systems have been reviewed and were otherwise negative with the exception of those mentioned in the HPI and as above.    PHYSICAL EXAM: Filed Vitals:   11/19/14 1555  BP: 142/80  Pulse: 93  Temp: 98.9 F (37.2 C)  Resp: 16   Body mass index is 55.77 kg/(m^2).   General: Alert, no acute distress, obese aa female HEENT:  Normocephalic, atraumatic, oropharynx patent. EOMI, PERRLA TM normal, ertyhematous throat, tonsils with some debris , not sure if exdautes,may be debris in tonsillar crypts but it sis red Cardiovascular:  Regular rate and rhythm, no rubs murmurs or gallops.  No Carotid bruits, radial pulse intact. No pedal edema.  Respiratory: Clear to auscultation bilaterally.  No wheezes, rales, or rhonchi.  No cyanosis, no use of accessory musculature Abdominal: No organomegaly, abdomen is soft and non-tender, positive bowel sounds. No masses. Skin: No rashes. Neurologic: Facial musculature symmetric. Psychiatric: Patient acts appropriately throughout our interaction. Lymphatic: No cervical or submandibular lymphadenopathy Musculoskeletal: Gait intact. No edema, tenderness   LABS: Results for orders placed or performed in visit on 11/19/14  POCT rapid strep A  Result Value Ref Range   Rapid Strep A Screen Negative Negative      EKG/XRAY:   Primary read interpreted by Dr. Marin Comment at [facility].   ASSESSMENT/PLAN: Encounter Diagnoses  Name Primary?  . Acute pharyngitis, unspecified etiology Yes  . Cough   . Acute URI    Throat cx Amoxacillin, amgic mouthwash and hycodan She is going up to [place] and wants to feel better fast  Gross sideeffects, risk and benefits, and alternatives of medications d/w patient. Patient is aware that all medications have potential sideeffects and we are unable to predict every sideeffect or drug-drug interaction that may occur.  Thao Le DO  11/19/2014 5:20 PM

## 2014-11-19 NOTE — Patient Instructions (Signed)

## 2014-11-21 LAB — CULTURE, GROUP A STREP: Organism ID, Bacteria: NORMAL

## 2015-04-06 ENCOUNTER — Other Ambulatory Visit (INDEPENDENT_AMBULATORY_CARE_PROVIDER_SITE_OTHER): Payer: Managed Care, Other (non HMO)

## 2015-04-06 DIAGNOSIS — Z0000 Encounter for general adult medical examination without abnormal findings: Secondary | ICD-10-CM

## 2015-04-06 LAB — BASIC METABOLIC PANEL
BUN: 6 mg/dL (ref 6–23)
CO2: 26 meq/L (ref 19–32)
Calcium: 9.2 mg/dL (ref 8.4–10.5)
Chloride: 104 mEq/L (ref 96–112)
Creatinine, Ser: 0.68 mg/dL (ref 0.40–1.20)
GFR: 126.24 mL/min (ref >60.00)
GLUCOSE: 95 mg/dL (ref 70–99)
POTASSIUM: 3.7 meq/L (ref 3.5–5.1)
Sodium: 138 mEq/L (ref 135–145)

## 2015-04-06 LAB — LIPID PANEL
CHOLESTEROL: 115 mg/dL (ref 0–200)
HDL: 48.2 mg/dL (ref >39.00)
LDL Cholesterol: 49 mg/dL (ref 0–99)
NonHDL: 66.67
TRIGLYCERIDES: 86 mg/dL (ref 0.0–149.0)
Total CHOL/HDL Ratio: 2
VLDL: 17.2 mg/dL (ref 0.0–40.0)

## 2015-04-06 LAB — HEPATIC FUNCTION PANEL
ALBUMIN: 4.1 g/dL (ref 3.5–5.2)
ALT: 19 U/L (ref 0–35)
AST: 14 U/L (ref 0–37)
Alkaline Phosphatase: 71 U/L (ref 39–117)
Bilirubin, Direct: 0.1 mg/dL (ref 0.0–0.3)
Total Bilirubin: 0.4 mg/dL (ref 0.2–1.2)
Total Protein: 8.2 g/dL (ref 6.0–8.3)

## 2015-04-06 LAB — CBC WITH DIFFERENTIAL/PLATELET
BASOS PCT: 0.9 % (ref 0.0–3.0)
Basophils Absolute: 0.1 10*3/uL (ref 0.0–0.1)
EOS PCT: 2.2 % (ref 0.0–5.0)
Eosinophils Absolute: 0.2 10*3/uL (ref 0.0–0.7)
HCT: 36.3 % (ref 36.0–46.0)
Hemoglobin: 11.9 g/dL — ABNORMAL LOW (ref 12.0–15.0)
LYMPHS ABS: 3 10*3/uL (ref 0.7–4.0)
Lymphocytes Relative: 38.3 % (ref 12.0–46.0)
MCHC: 32.9 g/dL (ref 30.0–36.0)
MCV: 77.8 fl — AB (ref 78.0–100.0)
MONO ABS: 0.2 10*3/uL (ref 0.1–1.0)
MONOS PCT: 2.4 % — AB (ref 3.0–12.0)
NEUTROS ABS: 4.5 10*3/uL (ref 1.4–7.7)
NEUTROS PCT: 56.2 % (ref 43.0–77.0)
PLATELETS: 290 10*3/uL (ref 150.0–400.0)
RBC: 4.67 Mil/uL (ref 3.87–5.11)
RDW: 15.4 % (ref 11.5–15.5)
WBC: 7.9 10*3/uL (ref 4.0–10.5)

## 2015-04-06 LAB — TSH: TSH: 2.42 u[IU]/mL (ref 0.35–4.50)

## 2015-04-09 ENCOUNTER — Encounter: Payer: Self-pay | Admitting: Family Medicine

## 2015-04-09 ENCOUNTER — Ambulatory Visit (INDEPENDENT_AMBULATORY_CARE_PROVIDER_SITE_OTHER): Payer: Managed Care, Other (non HMO) | Admitting: Family Medicine

## 2015-04-09 VITALS — BP 130/78 | HR 103 | Temp 98.7°F | Ht 62.0 in | Wt 311.0 lb

## 2015-04-09 DIAGNOSIS — Z0000 Encounter for general adult medical examination without abnormal findings: Secondary | ICD-10-CM | POA: Diagnosis not present

## 2015-04-09 DIAGNOSIS — Z23 Encounter for immunization: Secondary | ICD-10-CM | POA: Diagnosis not present

## 2015-04-09 NOTE — Progress Notes (Signed)
Subjective:    Patient ID: Misty Salazar, female    DOB: [birth year], 34 y.o.   MRN: [id number]  HPI  Patient seen for physical exam. She sees gynecologist and had passed her back in 11-09-2022 reportedly normal. She has history of hyperthyroidism and is followed by endocrinology and maintain currently on low-dose Tapazole. She is in process of being tapered off. Nonsmoker.  Tetanus shot up-to-date. No flu vaccine but requesting today.  Long history of obesity. She just joined a gym and plans to start weight loss program soon.  Past Medical History  Diagnosis Date  . Anemia   . Thyroid disease     hyperthyroidism  . Hyperthyroidism   . Allergy    Past Surgical History  Procedure Laterality Date  . Knee surgery  08/2007, 11-08-09    right knee- remove fibroma  . Cholecystectomy  01/20/2011    Procedure: LAPAROSCOPIC CHOLECYSTECTOMY;  Surgeon: Judieth Keens, DO;  Location: [facility];  Service: General;  Laterality: N/A;    reports that she has never smoked. She has never used smokeless tobacco. She reports that she drinks alcohol. She reports that she does not use illicit drugs. family history includes Cancer in her maternal grandmother and mother; Diabetes in her father; Hyperlipidemia in her father and mother; Hypertension in her father and mother; Stroke in her maternal grandfather, maternal grandmother, and paternal grandfather. Allergies  Allergen Reactions  . Percocet [Oxycodone-Acetaminophen] Hives and Itching      Review of Systems  Constitutional: Negative for fever, activity change, appetite change, fatigue and unexpected weight change.  HENT: Negative for ear pain, hearing loss, sore throat and trouble swallowing.   Eyes: Negative for visual disturbance.  Respiratory: Negative for cough and shortness of breath.   Cardiovascular: Negative for chest pain and palpitations.  Gastrointestinal: Negative for abdominal pain, diarrhea, constipation and blood in stool.    Endocrine: Negative for cold intolerance, heat intolerance, polydipsia and polyuria.  Genitourinary: Negative for dysuria and hematuria.  Musculoskeletal: Negative for myalgias, back pain and arthralgias.  Skin: Negative for rash.  Neurological: Negative for dizziness, syncope and headaches.  Hematological: Negative for adenopathy.  Psychiatric/Behavioral: Negative for confusion and dysphoric mood.       Objective:   Physical Exam  Constitutional: She is oriented to person, place, and time. She appears well-developed and well-nourished.  HENT:  Head: Normocephalic and atraumatic.  Eyes: EOM are normal. Pupils are equal, round, and reactive to light.  Neck: Normal range of motion. Neck supple. No thyromegaly present.  Cardiovascular: Normal rate, regular rhythm and normal heart sounds.   No murmur heard. Pulmonary/Chest: Breath sounds normal. No respiratory distress. She has no wheezes. She has no rales.  Abdominal: Soft. Bowel sounds are normal. She exhibits no distension and no mass. There is no tenderness. There is no rebound and no guarding.  Genitourinary:  Per GYN  Musculoskeletal: Normal range of motion. She exhibits no edema.  Lymphadenopathy:    She has no cervical adenopathy.  Neurological: She is alert and oriented to person, place, and time. She displays normal reflexes. No cranial nerve deficit.  Skin: No rash noted.  Psychiatric: She has a normal mood and affect. Her behavior is normal. Judgment and thought content normal.          Assessment & Plan:  . Physical exam. Flu vaccine given. Labs reviewed with no major concerns. She has a long history of chronic very mild microcytic anemia. Question hereditary such as thalassemia minor. We  discussed weight loss strategies at some length. She will consider getting started back with weight watchers or other weight loss program. Continue to monitor blood pressure closely with strong family history

## 2015-04-09 NOTE — Patient Instructions (Signed)
Exercising to Lose Weight Exercising can help you to lose weight. In order to lose weight through exercise, you need to do vigorous-intensity exercise. You can tell that you are exercising with vigorous intensity if you are breathing very hard and fast and cannot hold a conversation while exercising. Moderate-intensity exercise helps to maintain your current weight. You can tell that you are exercising at a moderate level if you have a higher heart rate and faster breathing, but you are still able to hold a conversation. HOW OFTEN SHOULD I EXERCISE? Choose an activity that you enjoy and set realistic goals. Your health care provider can help you to make an activity plan that works for you. Exercise regularly as directed by your health care provider. This may include:  Doing resistance training twice each week, such as:  Push-ups.  Sit-ups.  Lifting weights.  Using resistance bands.  Doing a given intensity of exercise for a given amount of time. Choose from these options:  150 minutes of moderate-intensity exercise every week.  75 minutes of vigorous-intensity exercise every week.  A mix of moderate-intensity and vigorous-intensity exercise every week. Children, pregnant women, people who are out of shape, people who are overweight, and older adults may need to consult a health care provider for individual recommendations. If you have any sort of medical condition, be sure to consult your health care provider before starting a new exercise program. WHAT ARE SOME ACTIVITIES THAT CAN HELP ME TO LOSE WEIGHT?   Walking at a rate of at least 4.5 miles an hour.  Jogging or running at a rate of 5 miles per hour.  Biking at a rate of at least 10 miles per hour.  Lap swimming.  Roller-skating or in-line skating.  Cross-country skiing.  Vigorous competitive sports, such as football, basketball, and soccer.  Jumping rope.  Aerobic dancing. HOW CAN I BE MORE ACTIVE IN MY DAY-TO-DAY  ACTIVITIES?  Use the stairs instead of the elevator.  Take a walk during your lunch break.  If you drive, park your car farther away from work or school.  If you take public transportation, get off one stop early and walk the rest of the way.  Make all of your phone calls while standing up and walking around.  Get up, stretch, and walk around every 30 minutes throughout the day. WHAT GUIDELINES SHOULD I FOLLOW WHILE EXERCISING?  Do not exercise so much that you hurt yourself, feel dizzy, or get very short of breath.  Consult your health care provider prior to starting a new exercise program.  Wear comfortable clothes and shoes with good support.  Drink plenty of water while you exercise to prevent dehydration or heat stroke. Body water is lost during exercise and must be replaced.  Work out until you breathe faster and your heart beats faster.   This information is not intended to replace advice given to you by your health care provider. Make sure you discuss any questions you have with your health care provider.   Document Released: 02/26/2010 Document Revised: 02/14/2014 Document Reviewed: 06/27/2013 Elsevier Interactive Patient Education 2016 [employer] Calorie Counting for Weight Loss Calories are energy you get from the things you eat and drink. Your body uses this energy to keep you going throughout the day. The number of calories you eat affects your weight. When you eat more calories than your body needs, your body stores the extra calories as fat. When you eat fewer calories than your body needs, your body burns   fat to get the energy it needs. Calorie counting means keeping track of how many calories you eat and drink each day. If you make sure to eat fewer calories than your body needs, you should lose weight. In order for calorie counting to work, you will need to eat the number of calories that are right for you in a day to lose a healthy amount of weight per week. A  healthy amount of weight to lose per week is usually 1-2 lb (0.5-0.9 kg). A dietitian can determine how many calories you need in a day and give you suggestions on how to reach your calorie goal.  WHAT IS MY MY PLAN? My goal is to have __________ calories per day.  If I have this many calories per day, I should lose around __________ pounds per week. WHAT DO I NEED TO KNOW ABOUT CALORIE COUNTING? In order to meet your daily calorie goal, you will need to:  Find out how many calories are in each food you would like to eat. Try to do this before you eat.  Decide how much of the food you can eat.  Write down what you ate and how many calories it had. Doing this is called keeping a food log. WHERE DO I FIND CALORIE INFORMATION? The number of calories in a food can be found on a Nutrition Facts label. Note that all the information on a label is based on a specific serving of the food. If a food does not have a Nutrition Facts label, try to look up the calories online or ask your dietitian for help. HOW DO I DECIDE HOW MUCH TO EAT? To decide how much of the food you can eat, you will need to consider both the number of calories in one serving and the size of one serving. This information can be found on the Nutrition Facts label. If a food does not have a Nutrition Facts label, look up the information online or ask your dietitian for help. Remember that calories are listed per serving. If you choose to have more than one serving of a food, you will have to multiply the calories per serving by the amount of servings you plan to eat. For example, the label on a package of bread might say that a serving size is 1 slice and that there are 90 calories in a serving. If you eat 1 slice, you will have eaten 90 calories. If you eat 2 slices, you will have eaten 180 calories. HOW DO I KEEP A FOOD LOG? After each meal, record the following information in your food log:  What you ate.  How much of it you  ate.  How many calories it had.  Then, add up your calories. Keep your food log near you, such as in a small notebook in your pocket. Another option is to use a mobile app or website. Some programs will calculate calories for you and show you how many calories you have left each time you add an item to the log. WHAT ARE SOME CALORIE COUNTING TIPS?  Use your calories on foods and drinks that will fill you up and not leave you hungry. Some examples of this include foods like nuts and nut butters, vegetables, lean proteins, and high-fiber foods (more than 5 g fiber per serving).  Eat nutritious foods and avoid empty calories. Empty calories are calories you get from foods or beverages that do not have many nutrients, such as candy and soda. It   is better to have a nutritious high-calorie food (such as an avocado) than a food with few nutrients (such as a bag of chips).  Know how many calories are in the foods you eat most often. This way, you do not have to look up how many calories they have each time you eat them.  Look out for foods that may seem like low-calorie foods but are really high-calorie foods, such as baked goods, soda, and fat-free candy.  Pay attention to calories in drinks. Drinks such as sodas, specialty coffee drinks, alcohol, and juices have a lot of calories yet do not fill you up. Choose low-calorie drinks like water and diet drinks.  Focus your calorie counting efforts on higher calorie items. Logging the calories in a garden salad that contains only vegetables is less important than calculating the calories in a milk shake.  Find a way of tracking calories that works for you. Get creative. Most people who are successful find ways to keep track of how much they eat in a day, even if they do not count every calorie. WHAT ARE SOME PORTION CONTROL TIPS?  Know how many calories are in a serving. This will help you know how many servings of a certain food you can have.  Use a  measuring cup to measure serving sizes. This is helpful when you start out. With time, you will be able to estimate serving sizes for some foods.  Take some time to put servings of different foods on your favorite plates, bowls, and cups so you know what a serving looks like.  Try not to eat straight from a bag or box. Doing this can lead to overeating. Put the amount you would like to eat in a cup or on a plate to make sure you are eating the right portion.  Use smaller plates, glasses, and bowls to prevent overeating. This is a quick and easy way to practice portion control. If your plate is smaller, less food can fit on it.  Try not to multitask while eating, such as watching TV or using your computer. If it is time to eat, sit down at a table and enjoy your food. Doing this will help you to start recognizing when you are full. It will also make you more aware of what and how much you are eating. HOW CAN I CALORIE COUNT WHEN EATING OUT?  Ask for smaller portion sizes or child-sized portions.  Consider sharing an entree and sides instead of getting your own entree.  If you get your own entree, eat only half. Ask for a box at the beginning of your meal and put the rest of your entree in it so you are not tempted to eat it.  Look for the calories on the menu. If calories are listed, choose the lower calorie options.  Choose dishes that include vegetables, fruits, whole grains, low-fat dairy products, and lean protein. Focusing on smart food choices from each of the 5 food groups can help you stay on track at restaurants.  Choose items that are boiled, broiled, grilled, or steamed.  Choose water, milk, unsweetened iced tea, or other drinks without added sugars. If you want an alcoholic beverage, choose a lower calorie option. For example, a regular margarita can have up to 700 calories and a glass of wine has around 150.  Stay away from items that are buttered, battered, fried, or served with  cream sauce. Items labeled "crispy" are usually fried, unless stated otherwise.    Ask for dressings, sauces, and syrups on the side. These are usually very high in calories, so do not eat much of them.  Watch out for salads. Many people think salads are a healthy option, but this is often not the case. Many salads come with bacon, fried chicken, lots of cheese, fried chips, and dressing. All of these items have a lot of calories. If you want a salad, choose a garden salad and ask for grilled meats or steak. Ask for the dressing on the side, or ask for olive oil and vinegar or lemon to use as dressing.  Estimate how many servings of a food you are given. For example, a serving of cooked rice is  cup or about the size of half a tennis ball or one cupcake wrapper. Knowing serving sizes will help you be aware of how much food you are eating at restaurants. The list below tells you how big or small some common portion sizes are based on everyday objects.  1 oz--4 stacked dice.  3 oz--1 deck of cards.  1 tsp--1 dice.  1 Tbsp-- a Ping-Pong ball.  2 Tbsp--1 Ping-Pong ball.   cup--1 tennis ball or 1 cupcake wrapper.  1 cup--1 baseball.   This information is not intended to replace advice given to you by your health care provider. Make sure you discuss any questions you have with your health care provider.   Document Released: 01/24/2005 Document Revised: 02/14/2014 Document Reviewed: 11/29/2012 Elsevier Interactive Patient Education 2016 [employer]vier Inc[employer]  

## 2015-04-09 NOTE — Progress Notes (Signed)
Pre visit review using our clinic review tool, if applicable. No additional management support is needed unless otherwise documented below in the visit note. 

## 2016-04-05 ENCOUNTER — Other Ambulatory Visit (INDEPENDENT_AMBULATORY_CARE_PROVIDER_SITE_OTHER): Payer: Managed Care, Other (non HMO)

## 2016-04-05 DIAGNOSIS — Z0000 Encounter for general adult medical examination without abnormal findings: Secondary | ICD-10-CM

## 2016-04-05 LAB — HEPATIC FUNCTION PANEL
ALT: 25 U/L (ref 0–35)
AST: 22 U/L (ref 0–37)
Albumin: 3.8 g/dL (ref 3.5–5.2)
Alkaline Phosphatase: 74 U/L (ref 39–117)
BILIRUBIN DIRECT: 0.1 mg/dL (ref 0.0–0.3)
BILIRUBIN TOTAL: 0.4 mg/dL (ref 0.2–1.2)
Total Protein: 7.5 g/dL (ref 6.0–8.3)

## 2016-04-05 LAB — BASIC METABOLIC PANEL
BUN: 6 mg/dL (ref 6–23)
CO2: 27 mEq/L (ref 19–32)
CREATININE: 0.62 mg/dL (ref 0.40–1.20)
Calcium: 9.3 mg/dL (ref 8.4–10.5)
Chloride: 101 mEq/L (ref 96–112)
GFR: 139.65 mL/min (ref >60.00)
Glucose, Bld: 147 mg/dL — ABNORMAL HIGH (ref 70–99)
POTASSIUM: 4 meq/L (ref 3.5–5.1)
Sodium: 138 mEq/L (ref 135–145)

## 2016-04-05 LAB — CBC WITH DIFFERENTIAL/PLATELET
Basophils Absolute: 0 10*3/uL (ref 0.0–0.1)
Basophils Relative: 0.4 % (ref 0.0–3.0)
EOS ABS: 0.1 10*3/uL (ref 0.0–0.7)
Eosinophils Relative: 1.9 % (ref 0.0–5.0)
HCT: 34.8 % — ABNORMAL LOW (ref 36.0–46.0)
HEMOGLOBIN: 11.5 g/dL — AB (ref 12.0–15.0)
Lymphocytes Relative: 37.3 % (ref 12.0–46.0)
Lymphs Abs: 2.8 10*3/uL (ref 0.7–4.0)
MCHC: 33 g/dL (ref 30.0–36.0)
MCV: 80.1 fl (ref 78.0–100.0)
MONO ABS: 0.4 10*3/uL (ref 0.1–1.0)
Monocytes Relative: 4.7 % (ref 3.0–12.0)
Neutro Abs: 4.2 10*3/uL (ref 1.4–7.7)
Neutrophils Relative %: 55.7 % (ref 43.0–77.0)
Platelets: 285 10*3/uL (ref 150.0–400.0)
RBC: 4.35 Mil/uL (ref 3.87–5.11)
RDW: 15.1 % (ref 11.5–15.5)
WBC: 7.5 10*3/uL (ref 4.0–10.5)

## 2016-04-05 LAB — LIPID PANEL
CHOL/HDL RATIO: 3
Cholesterol: 128 mg/dL (ref 0–200)
HDL: 50.7 mg/dL (ref >39.00)
LDL CALC: 53 mg/dL (ref 0–99)
NonHDL: 76.89
Triglycerides: 119 mg/dL (ref 0.0–149.0)
VLDL: 23.8 mg/dL (ref 0.0–40.0)

## 2016-04-05 LAB — TSH: TSH: 3.09 u[IU]/mL (ref 0.35–4.50)

## 2016-04-12 ENCOUNTER — Encounter: Payer: Self-pay | Admitting: Family Medicine

## 2016-04-12 ENCOUNTER — Ambulatory Visit (INDEPENDENT_AMBULATORY_CARE_PROVIDER_SITE_OTHER): Payer: Managed Care, Other (non HMO) | Admitting: Family Medicine

## 2016-04-12 VITALS — BP 124/84 | HR 84 | Temp 98.3°F | Ht 63.0 in | Wt 319.7 lb

## 2016-04-12 DIAGNOSIS — R739 Hyperglycemia, unspecified: Secondary | ICD-10-CM | POA: Diagnosis not present

## 2016-04-12 DIAGNOSIS — Z0000 Encounter for general adult medical examination without abnormal findings: Secondary | ICD-10-CM

## 2016-04-12 DIAGNOSIS — Z23 Encounter for immunization: Secondary | ICD-10-CM | POA: Diagnosis not present

## 2016-04-12 DIAGNOSIS — E119 Type 2 diabetes mellitus without complications: Secondary | ICD-10-CM | POA: Diagnosis not present

## 2016-04-12 LAB — POCT GLYCOSYLATED HEMOGLOBIN (HGB A1C): HEMOGLOBIN A1C: 8.4

## 2016-04-12 NOTE — Patient Instructions (Signed)
Hyperglycemia  Hyperglycemia occurs when the level of sugar (glucose) in the blood is too high. Glucose is a type of sugar that provides the body's main source of energy. Certain hormones (insulin and glucagon) control the level of glucose in the blood. Insulin lowers blood glucose, and glucagon increases blood glucose. Hyperglycemia can result from having too little insulin in the bloodstream, or from the body not responding normally to insulin.  Hyperglycemia occurs most often in people who have diabetes (diabetes mellitus), but it can happen in people who do not have diabetes. It can develop quickly, and it can be life-threatening if it causes you to become severely dehydrated (diabetic ketoacidosis or hyperglycemic hyperosmolar state). Severe hyperglycemia is a medical emergency.  What are the causes?  If you have diabetes, hyperglycemia may be caused by:  · Diabetes medicine.  · Medicines that increase blood glucose or affect your diabetes control.  · Not eating enough, or not eating often enough.  · Changes in physical activity level.  · Being sick or having an infection.    If you have prediabetes or undiagnosed diabetes:  · Hyperglycemia may be caused by those conditions.    If you do not have diabetes, hyperglycemia may be caused by:  · Certain medicines, including steroid medicines, beta-blockers, epinephrine, and thiazide diuretics.  · Stress.  · Serious illness.  · Surgery.  · Diseases of the pancreas.  · Infection.    What increases the risk?  Hyperglycemia is more likely to develop in people who have risk factors for diabetes, such as:  · Having a family member with diabetes.  · Having a gene for type 1 diabetes that is passed from parent to child (inherited).  · Living in an area with cold weather conditions.  · Exposure to certain viruses.  · Certain conditions in which the body's disease-fighting (immune) system attacks itself (autoimmune disorders).  · Being overweight or obese.  · Having an  inactive (sedentary) lifestyle.  · Having been diagnosed with insulin resistance.  · Having a history of prediabetes, gestational diabetes, or polycystic ovarian syndrome (PCOS).  · Being of American-Indian, African-American, Hispanic/Latino, or Asian/Pacific Islander descent.    What are the signs or symptoms?  Hyperglycemia may not cause any symptoms. If you do have symptoms, they may include early warning signs, such as:  · Increased thirst.  · Hunger.  · Feeling very tired.  · Needing to urinate more often than usual.  · Blurry vision.    Other symptoms may develop if hyperglycemia gets worse, such as:  · Dry mouth.  · Loss of appetite.  · Fruity-smelling breath.  · Weakness.  · Unexpected or rapid weight gain or weight loss.  · Tingling or numbness in the hands or feet.  · Headache.  · Skin that does not quickly return to normal after being lightly pinched and released (poor skin turgor).  · Abdominal pain.  · Cuts or bruises that are slow to heal.    How is this diagnosed?  Hyperglycemia is diagnosed with a blood test to measure your blood glucose level. This blood test is usually done while you are having symptoms. Your health care provider may also do a physical exam and review your medical history.  You may have more tests to determine the cause of your hyperglycemia, such as:  · A fasting blood glucose (FBG) test. You will not be allowed to eat (you will fast) for at least 8 hours before a blood sample is   taken.  · An A1c (hemoglobin A1c) blood test. This provides information about blood glucose control over the previous 2-3 months.  · An oral glucose tolerance test (OGTT). This measures your blood glucose at two times:  ? After fasting. This is your baseline blood glucose level.  ? Two hours after drinking a beverage that contains glucose.    How is this treated?  Treatment depends on the cause of your hyperglycemia. Treatment may include:  · Taking medicine to regulate your blood glucose levels. If you  take insulin or other diabetes medicines, your medicine or dosage may be adjusted.  · Lifestyle changes, such as exercising more, eating healthier foods, or losing weight.  · Treating an illness or infection, if this caused your hyperglycemia.  · Checking your blood glucose more often.  · Stopping or reducing steroid medicines, if these caused your hyperglycemia.    If your hyperglycemia becomes severe and it results in hyperglycemic hyperosmolar state, you must be hospitalized and given IV fluids.  Follow these instructions at home:  General instructions   · Take over-the-counter and prescription medicines only as told by your health care provider.  · Do not use any products that contain nicotine or tobacco, such as cigarettes and e-cigarettes. If you need help quitting, ask your health care provider.  · Limit alcohol intake to no more than 1 drink per day for nonpregnant women and 2 drinks per day for men. One drink equals 12 oz of beer, 5 oz of wine, or 1½ oz of hard liquor.  · Learn to manage stress. If you need help with this, ask your health care provider.  · Keep all follow-up visits as told by your health care provider. This is important.  Eating and drinking   · Maintain a healthy weight.  · Exercise regularly, as directed by your health care provider.  · Stay hydrated, especially when you exercise, get sick, or spend time in hot temperatures.  · Eat healthy foods, such as:  ? Lean proteins.  ? Complex carbohydrates.  ? Fresh fruits and vegetables.  ? Low-fat dairy products.  ? Healthy fats.  · Drink enough fluid to keep your urine clear or pale yellow.  If you have diabetes:     · Make sure you know the symptoms of hyperglycemia.  · Follow your diabetes management plan, as told by your health care provider. Make sure you:  ? Take your insulin and medicines as directed.  ? Follow your exercise plan.  ? Follow your meal plan. Eat on time, and do not skip meals.  ? Check your blood glucose as often as  directed. Make sure to check your blood glucose before and after exercise. If you exercise longer or in a different way than usual, check your blood glucose more often.  ? Follow your sick day plan whenever you cannot eat or drink normally. Make this plan in advance with your health care provider.  · Share your diabetes management plan with people in your workplace, school, and household.  · Check your urine for ketones when you are ill and as told by your health care provider.  · Carry a medical alert card or wear medical alert jewelry.  Contact a health care provider if:  · Your blood glucose is at or above 240 mg/dL (13.3 mmol/L) for 2 days in a row.  · You have problems keeping your blood glucose in your target range.  · You have frequent episodes of hyperglycemia.    Get help right away if:  · You have difficulty breathing.  · You have a change in how you think, feel, or act (mental status).  · You have nausea or vomiting that does not go away.  These symptoms may represent a serious problem that is an emergency. Do not wait to see if the symptoms will go away. Get medical help right away. Call your local emergency services (911 in the [place] Do not drive yourself to the hospital.  Summary  · Hyperglycemia occurs when the level of sugar (glucose) in the blood is too high.  · Hyperglycemia is diagnosed with a blood test to measure your blood glucose level. This blood test is usually done while you are having symptoms. Your health care provider may also do a physical exam and review your medical history.  · If you have diabetes, follow your diabetes management plan as told by your health care provider.  · Contact your health care provider if you have problems keeping your blood glucose in your target range.  This information is not intended to replace advice given to you by your health care provider. Make sure you discuss any questions you have with your health care provider.  Document Released: 07/20/2000 Document  Revised: 10/12/2015 Document Reviewed: 10/12/2015  Elsevier Interactive Patient Education © 2017 Elsevier Inc[employer]

## 2016-04-12 NOTE — Progress Notes (Signed)
Pre visit review using our clinic review tool, if applicable. No additional management support is needed unless otherwise documented below in the visit note. 

## 2016-04-12 NOTE — Progress Notes (Signed)
Subjective:     Patient ID: Misty Salazar, female   DOB: 03-18-1980, 36 y.o.   MRN: [id number]  HPI Patient seen for physical exam. She sees a gynecologist and had Pap smear last fall reportedly normal. She and her husband have considered trying to get pregnant later this year. She currently takes no medications. She has prior history of hyperthyroidism and has been tapered off methimazole. She's had some occasional reflux symptoms. She's tried to reduce citric/acidic foods and fried foods and usually manages symptoms with TUMS.  Currently not exercising. Strong family history of diabetes in her father and several other relatives. No recent polyuria or polydipsia. Has not had flu vaccine and is requesting today.  Past Medical History:  Diagnosis Date  . Allergy   . Anemia   . Hyperthyroidism   . Thyroid disease    hyperthyroidism   Past Surgical History:  Procedure Laterality Date  . CHOLECYSTECTOMY  01/20/2011   Procedure: LAPAROSCOPIC CHOLECYSTECTOMY;  Surgeon: Judieth Keens, DO;  Location: [facility];  Service: General;  Laterality: N/A;  . KNEE SURGERY  08/2007, 10/2009   right knee- remove fibroma    reports that she has never smoked. She has never used smokeless tobacco. She reports that she drinks alcohol. She reports that she does not use drugs. family history includes Cancer in her maternal grandmother and mother; Diabetes in her father; Hyperlipidemia in her father and mother; Hypertension in her father and mother; Stroke in her maternal grandfather, maternal grandmother, and paternal grandfather. Allergies  Allergen Reactions  . Percocet [Oxycodone-Acetaminophen] Hives and Itching     Review of Systems  Constitutional: Negative for activity change, appetite change, fatigue, fever and unexpected weight change.  HENT: Negative for ear pain, hearing loss, sore throat and trouble swallowing.   Eyes: Negative for visual disturbance.  Respiratory: Negative for cough  and shortness of breath.   Cardiovascular: Negative for chest pain and palpitations.  Gastrointestinal: Negative for abdominal pain, blood in stool, constipation and diarrhea.  Genitourinary: Negative for dysuria and hematuria.  Musculoskeletal: Negative for arthralgias, back pain and myalgias.  Skin: Negative for rash.  Neurological: Negative for dizziness, syncope and headaches.  Hematological: Negative for adenopathy.  Psychiatric/Behavioral: Negative for confusion and dysphoric mood.       Objective:   Physical Exam  Constitutional: She is oriented to person, place, and time. She appears well-developed and well-nourished.  HENT:  Head: Normocephalic and atraumatic.  Eyes: EOM are normal. Pupils are equal, round, and reactive to light.  Neck: Normal range of motion. Neck supple. No thyromegaly present.  Cardiovascular: Normal rate, regular rhythm and normal heart sounds.   No murmur heard. Pulmonary/Chest: Breath sounds normal. No respiratory distress. She has no wheezes. She has no rales.  Abdominal: Soft. Bowel sounds are normal. She exhibits no distension and no mass. There is no tenderness. There is no rebound and no guarding.  Musculoskeletal: Normal range of motion. She exhibits no edema.  Lymphadenopathy:    She has no cervical adenopathy.  Neurological: She is alert and oriented to person, place, and time. She displays normal reflexes. No cranial nerve deficit.  Skin: No rash noted.  Psychiatric: She has a normal mood and affect. Her behavior is normal. Judgment and thought content normal.       Assessment:     Physical exam. Labs reviewed. She has fasting blood sugar 147 with no prior history of hyperglycemia. Pap smear up-to-date.  Other labs unremarkable    Plan:     -  Flu vaccine given -Check hemoglobin A1c=8.4% -Long discussion regarding reduction in overall calories and reducing high glycemic foods -Plan follow-up within 2-3 months and repeat fasting glucose  and A1c then -we discussed initiation of metformin and she prefers one month of intense dietary change/exercise and one month follow up to repeat fasting glucose. -discussed referral to [facility] and she defers at this time.  [name] MD [facility]

## 2016-05-13 ENCOUNTER — Ambulatory Visit (INDEPENDENT_AMBULATORY_CARE_PROVIDER_SITE_OTHER): Payer: Managed Care, Other (non HMO) | Admitting: Family Medicine

## 2016-05-13 ENCOUNTER — Encounter: Payer: Self-pay | Admitting: Family Medicine

## 2016-05-13 VITALS — BP 130/90 | HR 79 | Temp 98.5°F | Wt 313.8 lb

## 2016-05-13 DIAGNOSIS — E1165 Type 2 diabetes mellitus with hyperglycemia: Secondary | ICD-10-CM | POA: Diagnosis not present

## 2016-05-13 LAB — POCT GLYCOSYLATED HEMOGLOBIN (HGB A1C): HEMOGLOBIN A1C: 8.1

## 2016-05-13 LAB — GLUCOSE, POCT (MANUAL RESULT ENTRY): POC GLUCOSE: 91 mg/dL (ref 70–99)

## 2016-05-13 NOTE — Progress Notes (Signed)
Pre visit review using our clinic review tool, if applicable. No additional management support is needed unless otherwise documented below in the visit note. 

## 2016-05-13 NOTE — Progress Notes (Signed)
Subjective:     Patient ID: Misty Salazar, female   DOB: 01-20-1981, 36 y.o.   MRN: [id number]  HPI   Patient's seen for follow-up regarding recently diagnosed type 2 diabetes. She decided against starting metformin. She has made some significant dietary changes with reducing carbohydrates and increasing protein intake. She is walking some for exercise and doing Zumba classes two days per week. She has lost a little over 5 pounds. Recent fasting blood sugar 147 with hemoglobin A1c 8.4%. No polyuria or polydipsia  Past Medical History:  Diagnosis Date  . Allergy   . Anemia   . Hyperthyroidism   . Thyroid disease    hyperthyroidism   Past Surgical History:  Procedure Laterality Date  . CHOLECYSTECTOMY  01/20/2011   Procedure: LAPAROSCOPIC CHOLECYSTECTOMY;  Surgeon: Judieth Keens, DO;  Location: [facility];  Service: General;  Laterality: N/A;  . KNEE SURGERY  08/2007, 10/2009   right knee- remove fibroma    reports that she has never smoked. She has never used smokeless tobacco. She reports that she drinks alcohol. She reports that she does not use drugs. family history includes Cancer in her maternal grandmother and mother; Diabetes in her father; Hyperlipidemia in her father and mother; Hypertension in her father and mother; Stroke in her maternal grandfather, maternal grandmother, and paternal grandfather. Allergies  Allergen Reactions  . Oxycodone-Acetaminophen Itching and Hives  . Percocet [Oxycodone-Acetaminophen] Hives and Itching     Review of Systems  Constitutional: Negative for activity change, appetite change, fatigue, fever and unexpected weight change.  HENT: Negative for ear pain, hearing loss, sore throat and trouble swallowing.   Eyes: Negative for visual disturbance.  Respiratory: Negative for cough and shortness of breath.   Cardiovascular: Negative for chest pain and palpitations.  Gastrointestinal: Negative for abdominal pain, blood in stool,  constipation and diarrhea.  Genitourinary: Negative for dysuria and hematuria.  Musculoskeletal: Negative for arthralgias, back pain and myalgias.  Skin: Negative for rash.  Neurological: Negative for dizziness, syncope and headaches.  Hematological: Negative for adenopathy.  Psychiatric/Behavioral: Negative for confusion and dysphoric mood.       Objective:   Physical Exam  Constitutional: She appears well-developed and well-nourished.  Eyes: Pupils are equal, round, and reactive to light.  Neck: Neck supple. No JVD present. No thyromegaly present.  Cardiovascular: Normal rate and regular rhythm.  Exam reveals no gallop.   Pulmonary/Chest: Effort normal and breath sounds normal. No respiratory distress. She has no wheezes. She has no rales.  Musculoskeletal: She exhibits no edema.  Neurological: She is alert.       Assessment:     Type 2 diabetes recently diagnosed. Improved with fasting glucose today 91. Hemoglobin A1c 8.1%    Plan:     -Continue lifestyle management. -Patient's declines metformin at this time -Diet discussed in some detail -Routine follow-up 3 months and repeat A1c then  [name] MD [facility] at [facility]

## 2016-08-12 ENCOUNTER — Encounter: Payer: Self-pay | Admitting: Family Medicine

## 2016-08-12 ENCOUNTER — Ambulatory Visit (INDEPENDENT_AMBULATORY_CARE_PROVIDER_SITE_OTHER): Payer: Managed Care, Other (non HMO) | Admitting: Family Medicine

## 2016-08-12 VITALS — BP 120/88 | HR 78 | Temp 98.5°F | Wt 313.8 lb

## 2016-08-12 DIAGNOSIS — E119 Type 2 diabetes mellitus without complications: Secondary | ICD-10-CM

## 2016-08-12 LAB — POCT GLYCOSYLATED HEMOGLOBIN (HGB A1C): Hemoglobin A1C: 6.5

## 2016-08-12 NOTE — Progress Notes (Signed)
Subjective:     Patient ID: Misty Salazar, female   DOB: [birth year], 36 y.o.   MRN: [id number]  HPI Patient seen for follow-up regarding recently diagnosed type 2 diabetes. Her initial A1c in March was 8.4% and this is down to 6.5% today. She elected against taking metformin. She has made some dietary changes and is also trying to exercise more. Her weight is unchanged from last visit. She does not have home glucose monitor. Denies any polyuria or polydipsia. She is following much lower carbohydrate diet and started some walking. She plans to see weight loss provider through her gynecology office. They apparently are considering medication options (for weight loss).  Past Medical History:  Diagnosis Date  . Allergy   . Anemia   . Hyperthyroidism   . Thyroid disease    hyperthyroidism   Past Surgical History:  Procedure Laterality Date  . CHOLECYSTECTOMY  01/20/2011   Procedure: LAPAROSCOPIC CHOLECYSTECTOMY;  Surgeon: Judieth Keens, DO;  Location: [facility];  Service: General;  Laterality: N/A;  . KNEE SURGERY  08/2007, 10/2009   right knee- remove fibroma    reports that she has never smoked. She has never used smokeless tobacco. She reports that she drinks alcohol. She reports that she does not use drugs. family history includes Cancer in her maternal grandmother and mother; Diabetes in her father; Hyperlipidemia in her father and mother; Hypertension in her father and mother; Stroke in her maternal grandfather, maternal grandmother, and paternal grandfather. Allergies  Allergen Reactions  . Oxycodone-Acetaminophen Itching and Hives  . Percocet [Oxycodone-Acetaminophen] Hives and Itching     Review of Systems  Constitutional: Negative for appetite change, fatigue and unexpected weight change.  Eyes: Negative for visual disturbance.  Respiratory: Negative for cough, chest tightness, shortness of breath and wheezing.   Cardiovascular: Negative for chest pain, palpitations and  leg swelling.  Endocrine: Negative for polydipsia and polyuria.  Neurological: Negative for dizziness, seizures, syncope, weakness, light-headedness and headaches.       Objective:   Physical Exam  Constitutional: She appears well-developed and well-nourished.  Eyes: Pupils are equal, round, and reactive to light.  Neck: Neck supple. No JVD present. No thyromegaly present.  Cardiovascular: Normal rate and regular rhythm.  Exam reveals no gallop.   Pulmonary/Chest: Effort normal and breath sounds normal. No respiratory distress. She has no wheezes. She has no rales.  Musculoskeletal: She exhibits no edema.  Neurological: She is alert.       Assessment:     Type 2 diabetes recently diagnosed. Even though her weight has not gone down her A1c has is down to 6.5% today.    Plan:     -Continue low carbohydrate diet and she is strong encouraged to lose some weight and continue regular aerobic exercise -Home glucose monitor given and consider checking at least couple times per week -Routine follow-up in 3 months and repeat A1c then  [name] MD [facility] at [facility]

## 2016-11-15 ENCOUNTER — Ambulatory Visit (INDEPENDENT_AMBULATORY_CARE_PROVIDER_SITE_OTHER): Payer: 59 | Admitting: Family Medicine

## 2016-11-15 ENCOUNTER — Encounter: Payer: Self-pay | Admitting: Family Medicine

## 2016-11-15 VITALS — BP 138/82 | HR 83 | Temp 98.0°F | Resp 16 | Ht 63.0 in | Wt 305.8 lb

## 2016-11-15 DIAGNOSIS — Z23 Encounter for immunization: Secondary | ICD-10-CM

## 2016-11-15 DIAGNOSIS — E119 Type 2 diabetes mellitus without complications: Secondary | ICD-10-CM | POA: Diagnosis not present

## 2016-11-15 LAB — POCT GLYCOSYLATED HEMOGLOBIN (HGB A1C): Hemoglobin A1C: 5.9

## 2016-11-15 NOTE — Progress Notes (Signed)
Subjective:     Patient ID: Misty Salazar, female   DOB: [birth year], 36 y.o.   MRN: [id number]  HPI Patient seen for follow-up regarding type 2 diabetes. Several months ago she was diagnosed with A1c 8.4%. Due to lifestyle changes and weight loss is down 6.5% few months ago and down to 5.9% today. She's Lost about another 8 Pounds. She's had occasional Setbacks with Diet before Overall Has Been Reducing Carbohydrates. She's Tried Exercising More. She Feels Good Overall. Currently Not Taking Any Regular Medications Fasting blood sugars at home have been ranging from around 90-100  Past Medical History:  Diagnosis Date  . Allergy   . Anemia   . Hyperthyroidism   . Thyroid disease    hyperthyroidism   Past Surgical History:  Procedure Laterality Date  . CHOLECYSTECTOMY  01/20/2011   Procedure: LAPAROSCOPIC CHOLECYSTECTOMY;  Surgeon: Judieth Keens, DO;  Location: [facility];  Service: General;  Laterality: N/A;  . KNEE SURGERY  08/2007, 10/2009   right knee- remove fibroma    reports that she has never smoked. She has never used smokeless tobacco. She reports that she drinks alcohol. She reports that she does not use drugs. family history includes Cancer in her maternal grandmother and mother; Diabetes in her father; Hyperlipidemia in her father and mother; Hypertension in her father and mother; Stroke in her maternal grandfather, maternal grandmother, and paternal grandfather. Allergies  Allergen Reactions  . Oxycodone-Acetaminophen Itching and Hives  . Percocet [Oxycodone-Acetaminophen] Hives and Itching     Review of Systems  Constitutional: Negative for fatigue.  Eyes: Negative for visual disturbance.  Respiratory: Negative for cough, chest tightness, shortness of breath and wheezing.   Cardiovascular: Negative for chest pain, palpitations and leg swelling.  Endocrine: Negative for polydipsia and polyuria.  Neurological: Negative for dizziness, seizures, syncope, weakness,  light-headedness and headaches.       Objective:   Physical Exam  Constitutional: She appears well-developed and well-nourished.  Eyes: Pupils are equal, round, and reactive to light.  Neck: Neck supple. No JVD present. No thyromegaly present.  Cardiovascular: Normal rate and regular rhythm.  Exam reveals no gallop.   Pulmonary/Chest: Effort normal and breath sounds normal. No respiratory distress. She has no wheezes. She has no rales.  Musculoskeletal: She exhibits no edema.  Neurological: She is alert.       Assessment:     Type 2 diabetes. Greatly improved with lifestyle modification and weight loss    Plan:     -Continue weight loss efforts. We discussed diet in some detail. Continue with regular aerobic exercise and ideally try to mix in some resistance training -Flu vaccine given -Routine follow-up in 3 months and sooner as needed  [name] MD [facility] at [facility]

## 2017-01-30 ENCOUNTER — Encounter: Payer: Self-pay | Admitting: Family Medicine

## 2017-01-30 ENCOUNTER — Ambulatory Visit (INDEPENDENT_AMBULATORY_CARE_PROVIDER_SITE_OTHER): Payer: 59 | Admitting: Family Medicine

## 2017-01-30 VITALS — BP 152/92 | HR 88 | Temp 98.2°F | Ht 63.0 in | Wt 303.4 lb

## 2017-01-30 DIAGNOSIS — R05 Cough: Secondary | ICD-10-CM | POA: Diagnosis not present

## 2017-01-30 DIAGNOSIS — R059 Cough, unspecified: Secondary | ICD-10-CM

## 2017-01-30 MED ORDER — DOXYCYCLINE HYCLATE 100 MG PO CAPS: 100.0000 mg | ORAL_CAPSULE | Freq: Two times a day (BID) | ORAL | 0 refills | Status: DC

## 2017-01-30 NOTE — Patient Instructions (Signed)
Your infection is likely viral Stay well hydrated Consider OTC Mucinex If you develop any fever or worsening symptoms start the Doxycycline.

## 2017-01-30 NOTE — Progress Notes (Signed)
Subjective:     Patient ID: Misty Salazar, female   DOB: August 24, 1980, 36 y.o.   MRN: [id number]  HPI Patient is nonsmoker who seen with chief complaint of cough which has become productive past couple days. She's had some intermittent cough since Thanksgiving. She said her husband had similar symptoms. She's not felt particularly ill but has had cough productive of thick sputum of green color past 2-3 days. She thinks she might have had some mild wheezing. No fever. No hemoptysis. She initially had laryngitis symptoms and those have cleared.  She has taken some decongestant past couple days which may raised her blood pressure today in office  Past Medical History:  Diagnosis Date  . Allergy   . Anemia   . Hyperthyroidism   . Thyroid disease    hyperthyroidism   Past Surgical History:  Procedure Laterality Date  . CHOLECYSTECTOMY  01/20/2011   Procedure: LAPAROSCOPIC CHOLECYSTECTOMY;  Surgeon: Judieth Keens, DO;  Location: [facility];  Service: General;  Laterality: N/A;  . KNEE SURGERY  08/2007, 10/2009   right knee- remove fibroma    reports that  has never smoked. she has never used smokeless tobacco. She reports that she drinks alcohol. She reports that she does not use drugs. family history includes Cancer in her maternal grandmother and mother; Diabetes in her father; Hyperlipidemia in her father and mother; Hypertension in her father and mother; Stroke in her maternal grandfather, maternal grandmother, and paternal grandfather. Allergies  Allergen Reactions  . Oxycodone-Acetaminophen Itching and Hives  . Percocet [Oxycodone-Acetaminophen] Hives and Itching     Review of Systems  Constitutional: Negative for chills and fever.  HENT: Negative for sinus pressure.   Respiratory: Positive for cough. Negative for shortness of breath.   Cardiovascular: Negative for chest pain.       Objective:   Physical Exam  Constitutional: She appears well-developed and  well-nourished.  HENT:  Right Ear: External ear normal.  Left Ear: External ear normal.  Mouth/Throat: Oropharynx is clear and moist. No oropharyngeal exudate.  Neck: Neck supple.  Cardiovascular: Normal rate and regular rhythm.  Pulmonary/Chest: Effort normal and breath sounds normal. No respiratory distress. She has no wheezes. She has no rales.  Lymphadenopathy:    She has no cervical adenopathy.       Assessment:     Cough. Suspect acute viral bronchitis. Nonfocal exam    Plan:     -We recommended observation for now -Avoid decongestants with elevated blood pressure and we will be reassessing her blood pressure at office follow-up in early January -Consider over-the-counter plain Mucinex as needed -Printed prescription for doxycycline but will only start if she has any fever or clearly worsening symptoms  [name] MD [facility] at [facility]

## 2017-02-15 ENCOUNTER — Ambulatory Visit: Payer: 59 | Admitting: Family Medicine

## 2017-02-24 ENCOUNTER — Encounter: Payer: Self-pay | Admitting: Family Medicine

## 2017-02-24 ENCOUNTER — Ambulatory Visit (INDEPENDENT_AMBULATORY_CARE_PROVIDER_SITE_OTHER): Payer: 59 | Admitting: Family Medicine

## 2017-02-24 VITALS — BP 120/70 | HR 82 | Temp 97.8°F | Ht 63.0 in | Wt 299.4 lb

## 2017-02-24 DIAGNOSIS — E119 Type 2 diabetes mellitus without complications: Secondary | ICD-10-CM

## 2017-02-24 LAB — POCT GLYCOSYLATED HEMOGLOBIN (HGB A1C): HEMOGLOBIN A1C: 6.3

## 2017-02-24 NOTE — Progress Notes (Signed)
Subjective:     Patient ID: Misty Salazar, female   DOB: Feb 09, 1980, 37 y.o.   MRN: [id number]  HPI Patient her follow-up type 2 diabetes. She has been somewhat less compliant with diet but her weight is actually down slightly from last visit. No polyuria or polydipsia. Inconsistent with exercise recently. She is considering starting Weight Watchers soon. Infrequently monitoring blood sugar at home  Past Medical History:  Diagnosis Date  . Allergy   . Anemia   . Hyperthyroidism   . Thyroid disease    hyperthyroidism   Past Surgical History:  Procedure Laterality Date  . CHOLECYSTECTOMY  01/20/2011   Procedure: LAPAROSCOPIC CHOLECYSTECTOMY;  Surgeon: Judieth Keens, DO;  Location: [facility];  Service: General;  Laterality: N/A;  . KNEE SURGERY  08/2007, 10/2009   right knee- remove fibroma    reports that  has never smoked. she has never used smokeless tobacco. She reports that she drinks alcohol. She reports that she does not use drugs. family history includes Cancer in her maternal grandmother and mother; Diabetes in her father; Hyperlipidemia in her father and mother; Hypertension in her father and mother; Stroke in her maternal grandfather, maternal grandmother, and paternal grandfather. Allergies  Allergen Reactions  . Oxycodone-Acetaminophen Itching and Hives  . Percocet [Oxycodone-Acetaminophen] Hives and Itching     Review of Systems  Constitutional: Negative for fatigue.  Eyes: Negative for visual disturbance.  Respiratory: Negative for cough, chest tightness, shortness of breath and wheezing.   Cardiovascular: Negative for chest pain, palpitations and leg swelling.  Endocrine: Negative for polydipsia and polyuria.  Neurological: Negative for dizziness, seizures, syncope, weakness, light-headedness and headaches.       Objective:   Physical Exam  Constitutional: She appears well-developed and well-nourished.  Eyes: Pupils are equal, round, and reactive to  light.  Neck: Neck supple. No JVD present. No thyromegaly present.  Cardiovascular: Normal rate and regular rhythm. Exam reveals no gallop.  Pulmonary/Chest: Effort normal and breath sounds normal. No respiratory distress. She has no wheezes. She has no rales.  Musculoskeletal: She exhibits no edema.  Neurological: She is alert.       Assessment:     Type 2 diabetes. A1c today 6.3% which is up slightly from 5.9% previously    Plan:     -establish more consistent exercise -Continue low glycemic diet -She will join Weight [employer] and hopefully that will help with her weight loss -Follow-up in 6 months  [name] MD [facility] at [facility]

## 2017-08-25 ENCOUNTER — Ambulatory Visit: Payer: 59 | Admitting: Family Medicine

## 2017-09-04 ENCOUNTER — Ambulatory Visit (INDEPENDENT_AMBULATORY_CARE_PROVIDER_SITE_OTHER): Payer: 59 | Admitting: Family Medicine

## 2017-09-04 ENCOUNTER — Encounter: Payer: Self-pay | Admitting: Family Medicine

## 2017-09-04 VITALS — BP 122/82 | HR 89 | Temp 99.1°F | Wt 296.0 lb

## 2017-09-04 DIAGNOSIS — E119 Type 2 diabetes mellitus without complications: Secondary | ICD-10-CM

## 2017-09-04 DIAGNOSIS — J209 Acute bronchitis, unspecified: Secondary | ICD-10-CM

## 2017-09-04 DIAGNOSIS — Z23 Encounter for immunization: Secondary | ICD-10-CM

## 2017-09-04 LAB — POCT GLYCOSYLATED HEMOGLOBIN (HGB A1C): Hemoglobin A1C: 5.9 % — AB (ref 4.0–5.6)

## 2017-09-04 NOTE — Patient Instructions (Signed)

## 2017-09-04 NOTE — Progress Notes (Signed)
  Subjective:     Patient ID: Misty Salazar, female   DOB: 1980/06/09, 37 y.o.   MRN: [id number]  HPI Patient seen for the following issues  Cough which has been mostly nonproductive for the past week. She's had some postnasal drainage. No fever. She has taken NyQuil which helps. Mild headaches. No facial pain.\  Type 2 diabetes. Last A1c 6.3%. She joined Weight Wat[employer] last visit but then only stayed with that for couple months. She had gotten her weight down to 288 pounds but has had some recent poor compliance. Not monitoring blood sugars regularly.  No history of Pneumovax. She sees ophthalmologist regularly. History of borderline glaucoma.  Past Medical History:  Diagnosis Date  . Allergy   . Anemia   . Hyperthyroidism   . Thyroid disease    hyperthyroidism   Past Surgical History:  Procedure Laterality Date  . CHOLECYSTECTOMY  01/20/2011   Procedure: LAPAROSCOPIC CHOLECYSTECTOMY;  Surgeon: Judieth Keens, DO;  Location: [facility];  Service: General;  Laterality: N/A;  . KNEE SURGERY  08/2007, 10/2009   right knee- remove fibroma    reports that she has never smoked. She has never used smokeless tobacco. She reports that she drinks alcohol. She reports that she does not use drugs. family history includes Cancer in her maternal grandmother and mother; Diabetes in her father; Hyperlipidemia in her father and mother; Hypertension in her father and mother; Stroke in her maternal grandfather, maternal grandmother, and paternal grandfather. Allergies  Allergen Reactions  . Oxycodone-Acetaminophen Itching and Hives  . Percocet [Oxycodone-Acetaminophen] Hives and Itching     Review of Systems  Constitutional: Negative for chills, fatigue and fever.  HENT: Positive for congestion.   Eyes: Negative for visual disturbance.  Respiratory: Positive for cough. Negative for chest tightness, shortness of breath and wheezing.   Cardiovascular: Negative for chest pain, palpitations  and leg swelling.  Neurological: Negative for dizziness, seizures, syncope, weakness, light-headedness and headaches.       Objective:   Physical Exam  Constitutional: She appears well-developed and well-nourished.  HENT:  Right Ear: External ear normal.  Left Ear: External ear normal.  Mouth/Throat: Oropharynx is clear and moist.  Eyes: Pupils are equal, round, and reactive to light.  Neck: Neck supple. No JVD present. No thyromegaly present.  Cardiovascular: Normal rate and regular rhythm. Exam reveals no gallop.  Pulmonary/Chest: Effort normal and breath sounds normal. No respiratory distress. She has no wheezes. She has no rales.  Musculoskeletal: She exhibits no edema.  Neurological: She is alert.       Assessment:     #1 cough. Suspect acute viral bronchitis. Nonfocal exam  #2 type 2 diabetes currently well controlled without medication. A1c today 5.9%  #3 morbid obesity-major co-morbidity is hyperglycemia.    Plan:     -Consider over-the-counter Mucinex twice daily. Follow-up promptly for any fever or increasing shortness of breath -Continue low glycemic type diet. She is encouraged to continue with weight loss efforts -Routine follow-up in 6 months. Consider urine microalbumin screen and lipid at that time -pneumovax given.  [name] MD  Primary Care at [facility]

## 2017-12-19 ENCOUNTER — Ambulatory Visit (INDEPENDENT_AMBULATORY_CARE_PROVIDER_SITE_OTHER): Payer: 59 | Admitting: Family Medicine

## 2017-12-19 ENCOUNTER — Ambulatory Visit (INDEPENDENT_AMBULATORY_CARE_PROVIDER_SITE_OTHER): Payer: 59

## 2017-12-19 ENCOUNTER — Other Ambulatory Visit: Payer: Self-pay

## 2017-12-19 ENCOUNTER — Encounter: Payer: Self-pay | Admitting: Family Medicine

## 2017-12-19 VITALS — BP 122/82 | HR 79 | Temp 98.4°F | Ht 63.0 in | Wt 303.5 lb

## 2017-12-19 DIAGNOSIS — M79672 Pain in left foot: Secondary | ICD-10-CM

## 2017-12-19 MED ORDER — DICLOFENAC SODIUM 1 % TD GEL: 2.0000 g | Freq: Four times a day (QID) | TRANSDERMAL | 1 refills | Status: DC

## 2017-12-19 NOTE — Patient Instructions (Signed)
Try icing the foot 15-20 minutes 2-3 times daily.

## 2017-12-19 NOTE — Progress Notes (Signed)
  Subjective:     Patient ID: Misty Salazar, female   DOB: 1980/07/09, 37 y.o.   MRN: [id number]  HPI Patient seen with left foot pain.  Has been going on since the summertime.  She has been exercising regularly.  Her pain is actually better somewhat with weightbearing and worse when she sits for a long-when she first gets up.  .  Location is dorsum left foot and somewhat proximal.  She has not had any ankle or foot injury.  No visible swelling.  No ecchymosis.  She tried some over-the-counter Aleve without improvement.  No sense of instability.  Past Medical History:  Diagnosis Date  . Allergy   . Anemia   . Hyperthyroidism   . Thyroid disease    hyperthyroidism   Past Surgical History:  Procedure Laterality Date  . CHOLECYSTECTOMY  01/20/2011   Procedure: LAPAROSCOPIC CHOLECYSTECTOMY;  Surgeon: Judieth Keens, DO;  Location: [facility];  Service: General;  Laterality: N/A;  . KNEE SURGERY  08/2007, 10/2009   right knee- remove fibroma    reports that she has never smoked. She has never used smokeless tobacco. She reports that she drinks alcohol. She reports that she does not use drugs. family history includes Cancer in her maternal grandmother and mother; Diabetes in her father; Hyperlipidemia in her father and mother; Hypertension in her father and mother; Stroke in her maternal grandfather, maternal grandmother, and paternal grandfather. Allergies  Allergen Reactions  . Oxycodone-Acetaminophen Itching and Hives  . Percocet [Oxycodone-Acetaminophen] Hives and Itching     Review of Systems  Neurological: Negative for weakness and numbness.       Objective:   Physical Exam  Constitutional: She appears well-developed and well-nourished.  Cardiovascular: Normal rate and regular rhythm.  Musculoskeletal:  Left foot reveals no edema.  No warmth.  No erythema.  Full range of motion left ankle.  She has some very minimal proximal dorsal left foot tenderness near the  midline.  No distal metatarsal tenderness.  No evidence for ankle effusion.  Achilles is nontender and intact       Assessment:     Several month history of dorsal left foot pain.  Suspect tendinitis.  Symptoms actually improved somewhat with activity which makes etiology such as stress fracture less likely    Plan:     -Obtain x-rays given duration of symptoms-no acute bony abnormality noted.  This will be over read. -If negative for fracture, consider topical diclofenac -Also recommend icing 15 to 20 minutes 2-3 times daily and good supportive shoe wear -Touch base if not improved in 3 to 4 weeks  [name] MD [facility] at [facility]

## 2017-12-22 ENCOUNTER — Telehealth: Payer: Self-pay | Admitting: *Deleted

## 2017-12-22 NOTE — Telephone Encounter (Signed)
Copied from [place] [id number]. Topic: General - Other >> Dec 22, 2017 12:29 PM Keene Breath wrote: Reason for CRM: Patient is returning call regarding her x-ray results.  Please call patient back at [phone]

## 2017-12-22 NOTE — Telephone Encounter (Signed)
Called patient and gave her xray results.

## 2018-03-09 ENCOUNTER — Ambulatory Visit: Payer: 59 | Admitting: Family Medicine

## 2018-03-27 ENCOUNTER — Encounter: Payer: 59 | Admitting: Family Medicine

## 2018-04-11 ENCOUNTER — Other Ambulatory Visit: Payer: Self-pay

## 2018-04-11 ENCOUNTER — Ambulatory Visit (INDEPENDENT_AMBULATORY_CARE_PROVIDER_SITE_OTHER): Payer: 59 | Admitting: Family Medicine

## 2018-04-11 ENCOUNTER — Encounter: Payer: Self-pay | Admitting: Family Medicine

## 2018-04-11 VITALS — BP 132/80 | HR 79 | Temp 98.3°F | Ht 62.0 in | Wt 304.3 lb

## 2018-04-11 DIAGNOSIS — Z0000 Encounter for general adult medical examination without abnormal findings: Secondary | ICD-10-CM

## 2018-04-11 LAB — BASIC METABOLIC PANEL
BUN: 10 mg/dL (ref 6–23)
CALCIUM: 9.9 mg/dL (ref 8.4–10.5)
CO2: 26 meq/L (ref 19–32)
Chloride: 101 mEq/L (ref 96–112)
Creatinine, Ser: 0.7 mg/dL (ref 0.40–1.20)
GFR: 112.99 mL/min (ref >60.00)
Glucose, Bld: 77 mg/dL (ref 70–99)
Potassium: 3.8 mEq/L (ref 3.5–5.1)
SODIUM: 136 meq/L (ref 135–145)

## 2018-04-11 LAB — LIPID PANEL
CHOL/HDL RATIO: 3
Cholesterol: 171 mg/dL (ref 0–200)
HDL: 50.1 mg/dL (ref >39.00)
LDL Cholesterol: 100 mg/dL — ABNORMAL HIGH (ref 0–99)
NonHDL: 121.22
TRIGLYCERIDES: 106 mg/dL (ref 0.0–149.0)
VLDL: 21.2 mg/dL (ref 0.0–40.0)

## 2018-04-11 LAB — HEPATIC FUNCTION PANEL
ALBUMIN: 4.6 g/dL (ref 3.5–5.2)
ALT: 14 U/L (ref 0–35)
AST: 15 U/L (ref 0–37)
Alkaline Phosphatase: 76 U/L (ref 39–117)
Bilirubin, Direct: 0.1 mg/dL (ref 0.0–0.3)
TOTAL PROTEIN: 9.1 g/dL — AB (ref 6.0–8.3)
Total Bilirubin: 0.7 mg/dL (ref 0.2–1.2)

## 2018-04-11 LAB — CBC WITH DIFFERENTIAL/PLATELET
BASOS ABS: 0.1 10*3/uL (ref 0.0–0.1)
BASOS PCT: 1.3 % (ref 0.0–3.0)
Eosinophils Absolute: 0.2 10*3/uL (ref 0.0–0.7)
Eosinophils Relative: 2.2 % (ref 0.0–5.0)
HCT: 39.2 % (ref 36.0–46.0)
Hemoglobin: 12.9 g/dL (ref 12.0–15.0)
LYMPHS ABS: 2.6 10*3/uL (ref 0.7–4.0)
Lymphocytes Relative: 36.3 % (ref 12.0–46.0)
MCHC: 32.8 g/dL (ref 30.0–36.0)
MCV: 80 fl (ref 78.0–100.0)
MONOS PCT: 3.8 % (ref 3.0–12.0)
Monocytes Absolute: 0.3 10*3/uL (ref 0.1–1.0)
NEUTROS ABS: 4 10*3/uL (ref 1.4–7.7)
NEUTROS PCT: 56.4 % (ref 43.0–77.0)
PLATELETS: 338 10*3/uL (ref 150.0–400.0)
RBC: 4.9 Mil/uL (ref 3.87–5.11)
RDW: 14.9 % (ref 11.5–15.5)
WBC: 7.1 10*3/uL (ref 4.0–10.5)

## 2018-04-11 LAB — MICROALBUMIN / CREATININE URINE RATIO
CREATININE, U: 98.3 mg/dL
MICROALB UR: 3 mg/dL — AB (ref 0.0–1.9)
Microalb Creat Ratio: 3.1 mg/g (ref 0.0–30.0)

## 2018-04-11 LAB — HEMOGLOBIN A1C: HEMOGLOBIN A1C: 6.3 % (ref 4.6–6.5)

## 2018-04-11 LAB — TSH: TSH: 1.2 u[IU]/mL (ref 0.35–4.50)

## 2018-04-11 NOTE — Progress Notes (Signed)
Subjective:     Patient ID: Misty Salazar, female   DOB: [birth year], 38 y.o.   MRN: [id number]  HPI Patient seen for physical exam.  She has type 2 diabetes which has been controlled without medication.  She has had very stressful year with death of her father-in-law.  She has been off her routine as far as exercise but plans to start back soon.  Her last A1c was 5.9%.  She sees gynecologist and had Pap smear through them last fall.  Tetanus is up-to-date.  Patient is non-smoker.  No family history of colon cancer or breast cancer.  Past Medical History:  Diagnosis Date  . Allergy   . Anemia   . Hyperthyroidism   . Thyroid disease    hyperthyroidism   Past Surgical History:  Procedure Laterality Date  . CHOLECYSTECTOMY  01/20/2011   Procedure: LAPAROSCOPIC CHOLECYSTECTOMY;  Surgeon: Judieth Keens, DO;  Location: [facility];  Service: General;  Laterality: N/A;  . KNEE SURGERY  08/2007, 10/2009   right knee- remove fibroma    reports that she has never smoked. She has never used smokeless tobacco. She reports current alcohol use. She reports that she does not use drugs. family history includes Cancer in her maternal grandmother and mother; Diabetes in her father; Hyperlipidemia in her father and mother; Hypertension in her father and mother; Stroke in her maternal grandfather, maternal grandmother, and paternal grandfather. Allergies  Allergen Reactions  . Oxycodone-Acetaminophen Itching and Hives  . Percocet [Oxycodone-Acetaminophen] Hives and Itching     Review of Systems  Constitutional: Negative for activity change, appetite change, fatigue, fever and unexpected weight change.  HENT: Negative for ear pain, hearing loss, sore throat and trouble swallowing.   Eyes: Negative for visual disturbance.  Respiratory: Negative for cough and shortness of breath.   Cardiovascular: Negative for chest pain and palpitations.  Gastrointestinal: Negative for abdominal pain, blood in  stool, constipation and diarrhea.  Endocrine: Negative for polydipsia and polyuria.  Genitourinary: Negative for dysuria and hematuria.  Musculoskeletal: Negative for arthralgias, back pain and myalgias.  Skin: Negative for rash.  Neurological: Negative for dizziness, syncope and headaches.  Hematological: Negative for adenopathy.  Psychiatric/Behavioral: Negative for confusion and dysphoric mood.       Objective:   Physical Exam Constitutional:      Appearance: She is well-developed.  HENT:     Head: Normocephalic and atraumatic.  Eyes:     Pupils: Pupils are equal, round, and reactive to light.  Neck:     Musculoskeletal: Normal range of motion and neck supple.     Thyroid: No thyromegaly.  Cardiovascular:     Rate and Rhythm: Normal rate and regular rhythm.     Heart sounds: Normal heart sounds. No murmur.  Pulmonary:     Effort: No respiratory distress.     Breath sounds: Normal breath sounds. No wheezing or rales.  Abdominal:     General: Bowel sounds are normal. There is no distension.     Palpations: Abdomen is soft. There is no mass.     Tenderness: There is no abdominal tenderness. There is no guarding or rebound.  Musculoskeletal: Normal range of motion.  Lymphadenopathy:     Cervical: No cervical adenopathy.  Skin:    Findings: No rash.     Comments: Feet reveal no skin lesions. Good distal foot pulses. Good capillary refill. No calluses. Normal sensation with monofilament testing   Neurological:     Mental Status: She is alert  and oriented to person, place, and time.     Cranial Nerves: No cranial nerve deficit.     Deep Tendon Reflexes: Reflexes normal.  Psychiatric:        Behavior: Behavior normal.        Thought Content: Thought content normal.        Judgment: Judgment normal.        Assessment:     Physical exam.  Patient has history of morbid obesity and type 2 diabetes which has been controlled without medication.  Her weight is up somewhat  from last year.    Plan:     -Labs including A1c and urine microalbumin screen.  She is encouraged to get yearly eye exam. -We strongly encouraged her to lose some weight through diet and exercise.  She will explore weight loss programs -she plans to continue with annual gyn follow up.  [name] MD [facility]

## 2019-04-16 ENCOUNTER — Other Ambulatory Visit: Payer: Self-pay

## 2019-04-17 ENCOUNTER — Other Ambulatory Visit: Payer: Self-pay

## 2019-04-17 ENCOUNTER — Encounter: Payer: Self-pay | Admitting: Family Medicine

## 2019-04-17 ENCOUNTER — Ambulatory Visit (INDEPENDENT_AMBULATORY_CARE_PROVIDER_SITE_OTHER): Payer: 59 | Admitting: Family Medicine

## 2019-04-17 VITALS — BP 122/72 | HR 79 | Temp 97.2°F | Ht 62.0 in | Wt 302.1 lb

## 2019-04-17 DIAGNOSIS — Z0000 Encounter for general adult medical examination without abnormal findings: Secondary | ICD-10-CM

## 2019-04-17 LAB — CBC WITH DIFFERENTIAL/PLATELET
Basophils Absolute: 0 10*3/uL (ref 0.0–0.1)
Basophils Relative: 0.6 % (ref 0.0–3.0)
Eosinophils Absolute: 0.2 10*3/uL (ref 0.0–0.7)
Eosinophils Relative: 2.5 % (ref 0.0–5.0)
HCT: 31 % — ABNORMAL LOW (ref 36.0–46.0)
Hemoglobin: 10.1 g/dL — ABNORMAL LOW (ref 12.0–15.0)
Lymphocytes Relative: 36.1 % (ref 12.0–46.0)
Lymphs Abs: 2.6 10*3/uL (ref 0.7–4.0)
MCHC: 32.6 g/dL (ref 30.0–36.0)
MCV: 73 fl — ABNORMAL LOW (ref 78.0–100.0)
Monocytes Absolute: 0.4 10*3/uL (ref 0.1–1.0)
Monocytes Relative: 5.2 % (ref 3.0–12.0)
Neutro Abs: 4 10*3/uL (ref 1.4–7.7)
Neutrophils Relative %: 55.6 % (ref 43.0–77.0)
Platelets: 385 10*3/uL (ref 150.0–400.0)
RBC: 4.24 Mil/uL (ref 3.87–5.11)
RDW: 16.7 % — ABNORMAL HIGH (ref 11.5–15.5)
WBC: 7.2 10*3/uL (ref 4.0–10.5)

## 2019-04-17 LAB — BASIC METABOLIC PANEL
BUN: 11 mg/dL (ref 6–23)
CO2: 27 mEq/L (ref 19–32)
Calcium: 9.6 mg/dL (ref 8.4–10.5)
Chloride: 105 mEq/L (ref 96–112)
Creatinine, Ser: 0.72 mg/dL (ref 0.40–1.20)
GFR: 108.8 mL/min (ref >60.00)
Glucose, Bld: 111 mg/dL — ABNORMAL HIGH (ref 70–99)
Potassium: 4.1 mEq/L (ref 3.5–5.1)
Sodium: 137 mEq/L (ref 135–145)

## 2019-04-17 LAB — LIPID PANEL
Cholesterol: 133 mg/dL (ref 0–200)
HDL: 36.4 mg/dL — ABNORMAL LOW (ref >39.00)
LDL Cholesterol: 80 mg/dL (ref 0–99)
NonHDL: 96.86
Total CHOL/HDL Ratio: 4
Triglycerides: 82 mg/dL (ref 0.0–149.0)
VLDL: 16.4 mg/dL (ref 0.0–40.0)

## 2019-04-17 LAB — MICROALBUMIN / CREATININE URINE RATIO
Creatinine,U: 107.7 mg/dL
Microalb Creat Ratio: 3.5 mg/g (ref 0.0–30.0)
Microalb, Ur: 3.7 mg/dL — ABNORMAL HIGH (ref 0.0–1.9)

## 2019-04-17 LAB — HEPATIC FUNCTION PANEL
ALT: 13 U/L (ref 0–35)
AST: 10 U/L (ref 0–37)
Albumin: 3.8 g/dL (ref 3.5–5.2)
Alkaline Phosphatase: 72 U/L (ref 39–117)
Bilirubin, Direct: 0.1 mg/dL (ref 0.0–0.3)
Total Bilirubin: 0.4 mg/dL (ref 0.2–1.2)
Total Protein: 8 g/dL (ref 6.0–8.3)

## 2019-04-17 LAB — TSH: TSH: 0.83 u[IU]/mL (ref 0.35–4.50)

## 2019-04-17 LAB — HEMOGLOBIN A1C: Hgb A1c MFr Bld: 7 % — ABNORMAL HIGH (ref 4.6–6.5)

## 2019-04-17 NOTE — Patient Instructions (Signed)
Health Maintenance, Female Adopting a healthy lifestyle and getting preventive care are important in promoting health and wellness. Ask your health care provider about:  The right schedule for you to have regular tests and exams.  Things you can do on your own to prevent diseases and keep yourself healthy. What should I know about diet, weight, and exercise? Eat a healthy diet   Eat a diet that includes plenty of vegetables, fruits, low-fat dairy products, and lean protein.  Do not eat a lot of foods that are high in solid fats, added sugars, or sodium. Maintain a healthy weight Body mass index (BMI) is used to identify weight problems. It estimates body fat based on height and weight. Your health care provider can help determine your BMI and help you achieve or maintain a healthy weight. Get regular exercise Get regular exercise. This is one of the most important things you can do for your health. Most adults should:  Exercise for at least 150 minutes each week. The exercise should increase your heart rate and make you sweat (moderate-intensity exercise).  Do strengthening exercises at least twice a week. This is in addition to the moderate-intensity exercise.  Spend less time sitting. Even light physical activity can be beneficial. Watch cholesterol and blood lipids Have your blood tested for lipids and cholesterol at 40 years of age, then have this test every 5 years. Have your cholesterol levels checked more often if:  Your lipid or cholesterol levels are high.  You are older than 40 years of age.  You are at high risk for heart disease. What should I know about cancer screening? Depending on your health history and family history, you may need to have cancer screening at various ages. This may include screening for:  Breast cancer.  Cervical cancer.  Colorectal cancer.  Skin cancer.  Lung cancer. What should I know about heart disease, diabetes, and high blood  pressure? Blood pressure and heart disease  High blood pressure causes heart disease and increases the risk of stroke. This is more likely to develop in people who have high blood pressure readings, are of African descent, or are overweight.  Have your blood pressure checked: ? Every 3-5 years if you are 18-39 years of age. ? Every year if you are 40 years old or older. Diabetes Have regular diabetes screenings. This checks your fasting blood sugar level. Have the screening done:  Once every three years after age 40 if you are at a normal weight and have a low risk for diabetes.  More often and at a younger age if you are overweight or have a high risk for diabetes. What should I know about preventing infection? Hepatitis B If you have a higher risk for hepatitis B, you should be screened for this virus. Talk with your health care provider to find out if you are at risk for hepatitis B infection. Hepatitis C Testing is recommended for:  Everyone born from 1945 through 1965.  Anyone with known risk factors for hepatitis C. Sexually transmitted infections (STIs)  Get screened for STIs, including gonorrhea and chlamydia, if: ? You are sexually active and are younger than 40 years of age. ? You are older than 40 years of age and your health care provider tells you that you are at risk for this type of infection. ? Your sexual activity has changed since you were last screened, and you are at increased risk for chlamydia or gonorrhea. Ask your health care provider if   you are at risk.  Ask your health care provider about whether you are at high risk for HIV. Your health care provider may recommend a prescription medicine to help prevent HIV infection. If you choose to take medicine to prevent HIV, you should first get tested for HIV. You should then be tested every 3 months for as long as you are taking the medicine. Pregnancy  If you are about to stop having your period (premenopausal) and  you may become pregnant, seek counseling before you get pregnant.  Take 400 to 800 micrograms (mcg) of folic acid every day if you become pregnant.  Ask for birth control (contraception) if you want to prevent pregnancy. Osteoporosis and menopause Osteoporosis is a disease in which the bones lose minerals and strength with aging. This can result in bone fractures. If you are 65 years old or older, or if you are at risk for osteoporosis and fractures, ask your health care provider if you should:  Be screened for bone loss.  Take a calcium or vitamin D supplement to lower your risk of fractures.  Be given hormone replacement therapy (HRT) to treat symptoms of menopause. Follow these instructions at home: Lifestyle  Do not use any products that contain nicotine or tobacco, such as cigarettes, e-cigarettes, and chewing tobacco. If you need help quitting, ask your health care provider.  Do not use street drugs.  Do not share needles.  Ask your health care provider for help if you need support or information about quitting drugs. Alcohol use  Do not drink alcohol if: ? Your health care provider tells you not to drink. ? You are pregnant, may be pregnant, or are planning to become pregnant.  If you drink alcohol: ? Limit how much you use to 0-1 drink a day. ? Limit intake if you are breastfeeding.  Be aware of how much alcohol is in your drink. In the [place] one drink equals one 12 oz bottle of beer (355 mL), one 5 oz glass of wine (148 mL), or one 1 oz glass of hard liquor (44 mL). General instructions  Schedule regular health, dental, and eye exams.  Stay current with your vaccines.  Tell your health care provider if: ? You often feel depressed. ? You have ever been abused or do not feel safe at home. Summary  Adopting a healthy lifestyle and getting preventive care are important in promoting health and wellness.  Follow your health care provider's instructions about healthy  diet, exercising, and getting tested or screened for diseases.  Follow your health care provider's instructions on monitoring your cholesterol and blood pressure. This information is not intended to replace advice given to you by your health care provider. Make sure you discuss any questions you have with your health care provider. Document Revised: 01/17/2018 Document Reviewed: 01/17/2018 Elsevier Patient Education  2020 [employer]  

## 2019-04-17 NOTE — Progress Notes (Signed)
Subjective:     Patient ID: Misty Salazar, female   DOB: 1980/04/01, 39 y.o.   MRN: [id number]  HPI   Misty Salazar is seen for physical exam.  She sees gynecologist yearly and is getting Pap smears through them.  She is also seeing a weight loss provider through their clinic and is currently on Qsymia.  She has lost about 10 pounds this year and about 27 pounds total.  She was diagnosed with type 2 diabetes couple years ago and has been out of manage this thus far without medications.  Her exercise has been curtailed somewhat this year with the pandemic.  She had several family deaths this year and coping with that.  She has set up some counseling recently.  No history of hypertension.  Her lipids have been fairly favorable.  She monitors blood pressure at home and is consistently had readings around [id number] systolic or less.  Her diastolics have been normal.  Past Medical History:  Diagnosis Date  . Allergy   . Anemia   . Hyperthyroidism   . Thyroid disease    hyperthyroidism   Past Surgical History:  Procedure Laterality Date  . CHOLECYSTECTOMY  01/20/2011   Procedure: LAPAROSCOPIC CHOLECYSTECTOMY;  Surgeon: Judieth Keens, DO;  Location: [facility];  Service: General;  Laterality: N/A;  . KNEE SURGERY  08/2007, 10/2009   right knee- remove fibroma    reports that she has never smoked. She has never used smokeless tobacco. She reports current alcohol use. She reports that she does not use drugs. family history includes Cancer in her maternal grandmother and mother; Diabetes in her father; Hyperlipidemia in her father and mother; Hypertension in her father and mother; Stroke in her maternal grandfather, maternal grandmother, and paternal grandfather. Allergies  Allergen Reactions  . Oxycodone-Acetaminophen Itching and Hives  . Percocet [Oxycodone-Acetaminophen] Hives and Itching     Review of Systems  Constitutional: Negative for activity change, appetite change, fatigue, fever and  unexpected weight change.  HENT: Negative for ear pain, hearing loss, sore throat and trouble swallowing.   Eyes: Negative for visual disturbance.  Respiratory: Negative for cough and shortness of breath.   Cardiovascular: Negative for chest pain and palpitations.  Gastrointestinal: Negative for abdominal pain, blood in stool, constipation and diarrhea.  Endocrine: Negative for polydipsia and polyuria.  Genitourinary: Negative for dysuria and hematuria.  Musculoskeletal: Negative for arthralgias, back pain and myalgias.  Skin: Negative for rash.  Neurological: Negative for dizziness, syncope and headaches.  Hematological: Negative for adenopathy.  Psychiatric/Behavioral: Negative for confusion and dysphoric mood.       Objective:   Physical Exam Constitutional:      Appearance: She is well-developed.  HENT:     Head: Normocephalic and atraumatic.  Eyes:     Pupils: Pupils are equal, round, and reactive to light.  Neck:     Thyroid: No thyromegaly.  Cardiovascular:     Rate and Rhythm: Normal rate and regular rhythm.     Heart sounds: Normal heart sounds. No murmur.  Pulmonary:     Effort: No respiratory distress.     Breath sounds: Normal breath sounds. No wheezing or rales.  Abdominal:     General: Bowel sounds are normal. There is no distension.     Palpations: Abdomen is soft. There is no mass.     Tenderness: There is no abdominal tenderness. There is no guarding or rebound.  Genitourinary:    Comments: Per gyn  Musculoskeletal:  General: Normal range of motion.     Cervical back: Normal range of motion and neck supple.  Lymphadenopathy:     Cervical: No cervical adenopathy.  Skin:    Findings: No rash.  Neurological:     Mental Status: She is alert and oriented to person, place, and time.     Cranial Nerves: No cranial nerve deficit.     Deep Tendon Reflexes: Reflexes normal.  Psychiatric:        Behavior: Behavior normal.        Thought Content: Thought  content normal.        Judgment: Judgment normal.        Assessment:     Physical exam.  She has morbid obesity.  She has type 2 diabetes which is been controlled in the past.    Plan:     -We will check screening labs including A1c and urine micro-albumin -We will check follow-up labs including urine microalbumin and A1c -Would have low threshold to consider medication such as GLP-1 and/or Metformin if her A1c starts to climb -Continue to monitor blood pressure and be in touch of consistent readings greater than [id number] systolic or 90 diastolic  [name] MD [facility]

## 2019-07-02 ENCOUNTER — Other Ambulatory Visit: Payer: Self-pay

## 2019-07-03 ENCOUNTER — Ambulatory Visit (INDEPENDENT_AMBULATORY_CARE_PROVIDER_SITE_OTHER): Payer: No Typology Code available for payment source | Admitting: Family Medicine

## 2019-07-03 ENCOUNTER — Encounter: Payer: Self-pay | Admitting: Family Medicine

## 2019-07-03 VITALS — BP 130/82 | HR 99 | Temp 98.2°F | Wt 301.7 lb

## 2019-07-03 DIAGNOSIS — M549 Dorsalgia, unspecified: Secondary | ICD-10-CM

## 2019-07-03 MED ORDER — CYCLOBENZAPRINE HCL 10 MG PO TABS: 10.0000 mg | ORAL_TABLET | Freq: Every day | ORAL | 0 refills | Status: DC

## 2019-07-03 NOTE — Progress Notes (Signed)
  Subjective:     Patient ID: Misty Salazar, female   DOB: [birth year], 39 y.o.   MRN: [id number]  HPI   Cordellia is seen with left upper back pain.  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  Her pain is really more along the superior and medial aspect of the left scapula.  No dyspnea.  No pleuritic pain.  No cough.  No injury.  No radiculitis symptoms.  She tried some topical sports cream with mild relief.  She does have some shoulder pain with abduction but this pain is somewhat different  Past Medical History:  Diagnosis Date  . Allergy   . Anemia   . Hyperthyroidism   . Thyroid disease    hyperthyroidism   Past Surgical History:  Procedure Laterality Date  . CHOLECYSTECTOMY  01/20/2011   Procedure: LAPAROSCOPIC CHOLECYSTECTOMY;  Surgeon: Judieth Keens, DO;  Location: [facility];  Service: General;  Laterality: N/A;  . KNEE SURGERY  08/2007, 10/2009   right knee- remove fibroma    reports that she has never smoked. She has never used smokeless tobacco. She reports current alcohol use. She reports that she does not use drugs. family history includes Cancer in her maternal grandmother and mother; Diabetes in her father; Hyperlipidemia in her father and mother; Hypertension in her father and mother; Stroke in her maternal grandfather, maternal grandmother, and paternal grandfather. Allergies  Allergen Reactions  . Oxycodone-Acetaminophen Itching and Hives  . Percocet [Oxycodone-Acetaminophen] Hives and Itching     Review of Systems  Constitutional: Negative for chills and fever.  Respiratory: Negative for cough and shortness of breath.   Cardiovascular: Negative for chest pain.  Musculoskeletal: Negative for neck pain.       Objective:   Physical Exam Vitals reviewed.  Constitutional:      Appearance: Normal appearance.  Cardiovascular:     Rate and Rhythm: Normal rate and regular rhythm.  Pulmonary:   Effort: Pulmonary effort is normal.     Breath sounds: Normal breath sounds.  Musculoskeletal:     Cervical back: Neck supple.     Comments: Good range of motion left shoulder.  She does have some localized tenderness left upper back near the medial superior border of the scapula but also has some trapezius tenderness bilaterally.  She has increased muscle tension bilaterally  Neurological:     Mental Status: She is alert.        Assessment:     Upper back pain.  She may have trigger point left upper back but has some diffuse trapezius tenderness and tension as well    Plan:     -We recommended regular aerobic exercise such as walking as tolerated  -Continue topical heat along with topical sports creams and muscle massage  -Nighttime trial of Flexeril 10 mg nightly and she is cautioned about sedation with that  -We recommend she try to lower her keyboard positioning to hopefully reduce tension during the day and also try to do several stretches throughout the day  -If not improved in a couple weeks consider trigger point injection left upper back and or physical therapy  [name] MD [facility]

## 2019-07-03 NOTE — Patient Instructions (Signed)
Try some heat and muscle massage  Continue with topical sports creams.    Lower position of keyboard.

## 2019-07-23 ENCOUNTER — Other Ambulatory Visit: Payer: Self-pay

## 2019-07-24 ENCOUNTER — Ambulatory Visit (INDEPENDENT_AMBULATORY_CARE_PROVIDER_SITE_OTHER): Payer: No Typology Code available for payment source | Admitting: Family Medicine

## 2019-07-24 ENCOUNTER — Encounter: Payer: Self-pay | Admitting: Family Medicine

## 2019-07-24 VITALS — BP 124/72 | HR 94 | Temp 97.9°F | Wt 299.4 lb

## 2019-07-24 DIAGNOSIS — E119 Type 2 diabetes mellitus without complications: Secondary | ICD-10-CM

## 2019-07-24 LAB — POCT GLYCOSYLATED HEMOGLOBIN (HGB A1C): Hemoglobin A1C: 6.5 % — AB (ref 4.0–5.6)

## 2019-07-24 NOTE — Progress Notes (Signed)
Established Patient Office Visit  Subjective:  Patient ID: Misty Salazar, female    DOB: Jun 15, 1980  Age: 39 y.o. MRN: [id number]  CC:  Chief Complaint  Patient presents with  . Follow-up    Pt is here for 3 month follow up    HPI Misty Salazar presents for follow-up regarding type 2 diabetes.  Her A1c had been steadily climbing from 5.9% to 6.3% to recent of 7.0% in March.  She realized that she been eating pretzels every day and after discontinuing that she feels her A1c will be improved.  She has lost about 3 pounds since then.  She has been referred to [facility] weight loss clinic.  No polyuria or polydipsia.  We discussed medication such as Metformin previously but she preferred lifestyle modification first  Past Medical History:  Diagnosis Date  . Allergy   . Anemia   . Hyperthyroidism   . Thyroid disease    hyperthyroidism    Past Surgical History:  Procedure Laterality Date  . CHOLECYSTECTOMY  01/20/2011   Procedure: LAPAROSCOPIC CHOLECYSTECTOMY;  Surgeon: Judieth Keens, DO;  Location: [facility];  Service: General;  Laterality: N/A;  . KNEE SURGERY  08/2007, 10/2009   right knee- remove fibroma    Family History  Problem Relation Age of Onset  . Hypertension Mother   . Hyperlipidemia Mother   . Cancer Mother        uterine cancer  . Diabetes Father   . Hypertension Father   . Hyperlipidemia Father   . Cancer Maternal Grandmother        cervical  . Stroke Maternal Grandmother   . Stroke Maternal Grandfather   . Stroke Paternal Grandfather     Social History   Socioeconomic History  . Marital status: Married    Spouse name: Not on file  . Number of children: Not on file  . Years of education: Not on file  . Highest education level: Not on file  Occupational History  . Not on file  Tobacco Use  . Smoking status: Never Smoker  . Smokeless tobacco: Never Used  Vaping Use  . Vaping Use: Never used  Substance and Sexual Activity  .  Alcohol use: Yes    Alcohol/week: 0.0 standard drinks    Comment: occasional   . Drug use: No  . Sexual activity: Not on file  Other Topics Concern  . Not on file  Social History Narrative  . Not on file   Social Determinants of Health   Financial Resource Strain:   . Difficulty of Paying Living Expenses:   Food Insecurity:   . Worried About Charity fundraiser in the Last Year:   . Arboriculturist in the Last Year:   Transportation Needs:   . Film/video editor (Medical):   Marland Kitchen Lack of Transportation (Non-Medical):   Physical Activity:   . Days of Exercise per Week:   . Minutes of Exercise per Session:   Stress:   . Feeling of Stress :   Social Connections:   . Frequency of Communication with Friends and Family:   . Frequency of Social Gatherings with Friends and Family:   . Attends Religious Services:   . Active Member of Clubs or Organizations:   . Attends Archivist Meetings:   Marland Kitchen Marital Status:   Intimate Partner Violence:   . Fear of Current or Ex-Partner:   . Emotionally Abused:   Marland Kitchen Physically Abused:   .  Sexually Abused:     Outpatient Medications Prior to Visit  Medication Sig Dispense Refill  . CAMILA 0.35 MG tablet     . cetirizine (ZYRTEC) 10 MG tablet Take 10 mg by mouth daily as needed. Allergies     . cyclobenzaprine (FLEXERIL) 10 MG tablet Take 1 tablet (10 mg total) by mouth at bedtime. 30 tablet 0  . IRON PO Take 1 tablet by mouth daily.     . Multiple Vitamin (MULTIVITAMIN) tablet Take 1 tablet by mouth daily.    . diclofenac sodium (VOLTAREN) 1 % GEL Apply 2 g topically 4 (four) times daily. (Patient not taking: Reported on 04/17/2019) 100 g 1  . QSYMIA 3.75-23 MG CP24 Take 1 tablet by mouth daily.  0  . QSYMIA 7.5-46 MG CP24 Take 1 capsule by mouth daily.     No facility-administered medications prior to visit.    Allergies  Allergen Reactions  . Oxycodone-Acetaminophen Itching and Hives  . Percocet [Oxycodone-Acetaminophen]  Hives and Itching    ROS Review of Systems  Constitutional: Negative for appetite change and unexpected weight change.  Endocrine: Negative for polydipsia and polyuria.      Objective:    Physical Exam Vitals reviewed.  Constitutional:      Appearance: Normal appearance.  Cardiovascular:     Rate and Rhythm: Normal rate and regular rhythm.  Pulmonary:     Effort: Pulmonary effort is normal.     Breath sounds: Normal breath sounds.  Neurological:     Mental Status: She is alert.     BP 124/72 (BP Location: Left Arm, Patient Position: Sitting, Cuff Size: Normal)   Pulse 94   Temp 97.9 F (36.6 C) (Temporal)   Wt 299 lb 6.4 oz (135.8 kg)   SpO2 94%   BMI 54.76 kg/m  Wt Readings from Last 3 Encounters:  07/24/19 299 lb 6.4 oz (135.8 kg)  07/03/19 (!) 301 lb 11.2 oz (136.9 kg)  04/17/19 (!) 302 lb 1.6 oz (137 kg)     Health Maintenance Due  Topic Date Due  . Hepatitis C Screening  Never done  . FOOT EXAM  Never done  . COVID-19 Vaccine (1) Never done  . OPHTHALMOLOGY EXAM  05/22/2019    There are no preventive care reminders to display for this patient.  Lab Results  Component Value Date   TSH 0.83 04/17/2019   Lab Results  Component Value Date   WBC 7.2 04/17/2019   HGB 10.1 Repeated and verified X2. (L) 04/17/2019   HCT 31.0 (L) 04/17/2019   MCV 73.0 (L) 04/17/2019   PLT 385.0 04/17/2019   Lab Results  Component Value Date   NA 137 04/17/2019   K 4.1 04/17/2019   CO2 27 04/17/2019   GLUCOSE 111 (H) 04/17/2019   BUN 11 04/17/2019   CREATININE 0.72 04/17/2019   BILITOT 0.4 04/17/2019   ALKPHOS 72 04/17/2019   AST 10 04/17/2019   ALT 13 04/17/2019   PROT 8.0 04/17/2019   ALBUMIN 3.8 04/17/2019   CALCIUM 9.6 04/17/2019   GFR 108.80 04/17/2019   Lab Results  Component Value Date   CHOL 133 04/17/2019   Lab Results  Component Value Date   HDL 36.40 (L) 04/17/2019   Lab Results  Component Value Date   LDLCALC 80 04/17/2019   Lab  Results  Component Value Date   TRIG 82.0 04/17/2019   Lab Results  Component Value Date   CHOLHDL 4 04/17/2019   Lab Results  Component Value Date   HGBA1C 6.5 (A) 07/24/2019      Assessment & Plan:   Problem List Items Addressed This Visit      Unprioritized   Type 2 diabetes mellitus without complications ([place]) - Primary   Relevant Orders   POCT glycosylated hemoglobin (Hb A1C) (Completed)    Improved with A1c today 6.5%  We strongly advise she continue with weight loss efforts and reduction in sugars and starches.  She has scheduled visit with [facility] weight loss clinic  We did discuss possible referral to diabetes educator but explained that the weight loss clinic is a multidisciplinary team and includes nutritionist and she would like to go that route first which seems reasonable  Recommend follow-up A1c within the next 6 months  No orders of the defined types were placed in this encounter.   Follow-up: No follow-ups on file.    Carolann Littler, MD

## 2019-07-24 NOTE — Patient Instructions (Signed)

## 2019-08-15 ENCOUNTER — Encounter
Payer: No Typology Code available for payment source | Attending: Obstetrics and Gynecology | Admitting: Skilled Nursing Facility1

## 2019-08-15 ENCOUNTER — Other Ambulatory Visit: Payer: Self-pay

## 2019-08-15 ENCOUNTER — Encounter: Payer: Self-pay | Admitting: Skilled Nursing Facility1

## 2019-08-15 DIAGNOSIS — E119 Type 2 diabetes mellitus without complications: Secondary | ICD-10-CM | POA: Diagnosis not present

## 2019-08-15 NOTE — Progress Notes (Signed)
  Assessment:  Primary concerns today: weight management.   Pt states she is currently taking qysemia; pt believes this education to not be working.  Pts A1C 6.5 and not on medication not checking her blood sugar daily a couple times a week; 98-101 always fasting. Pt states due to bronchitis she has not been able to work out.  Pt states she tries to do the gym 3 times a week and 1 day at home.  Wakes 5:45 starting eating at 9am.  Pt states she is absolutely not interested in bariatric surgery.   MEDICATIONS: see list   DIETARY INTAKE:  Usual eating pattern includes 2 meals and 2 snacks per day.  Everyday foods include none stated.  Avoided foods include none stated    24-hr recall:  B ( AM): skipped Snk ( 9:30-10AM): yogurt + grapes or protein bar + yogurt L ( 12:30-1PM): chicken breast + broccoli + brussels sprouts + rice Snk ( PM): pretzels or fruit or protein bar or seeds or little rice cakes  D ( PM): chicken or salmon or shrimp + broccoli + potatoes  Snk ( PM): gummy fruit snacks Beverages: water, coffee, sometimes flavored drink of some kind  Usual physical activity: ADl's   Estimated energy needs: 1600 calories  Progress Towards Goal(s):  In progress.    Intervention:  Nutrition counseling. Dietitian educated pt on listening to her own body cue to determine her satisfaction verse fullness to identify when to stop eating.   Goals: challenge diet thoughts; good verses bad; working on getting rid of that anxiety and trust you are making healthy decisions for your body Great job on creating balanced meals! Get back into the gym doing your physical activity routine  Move your breakfast up to within 1-1.5 hours after waking  Teaching Method Utilized:  Visual Auditory Hands on  Handouts given during visit include:  Book list for intuitive eating  Barriers to learning/adherence to lifestyle change: none identified   Demonstrated degree of understanding via:  Teach  Back   Monitoring/Evaluation:  Dietary intake, exercise, and body weight prn.

## 2019-10-31 LAB — HM DIABETES EYE EXAM

## 2019-12-17 DIAGNOSIS — Z1231 Encounter for screening mammogram for malignant neoplasm of breast: Secondary | ICD-10-CM | POA: Diagnosis not present

## 2019-12-17 DIAGNOSIS — Z01419 Encounter for gynecological examination (general) (routine) without abnormal findings: Secondary | ICD-10-CM | POA: Diagnosis not present

## 2019-12-17 DIAGNOSIS — Z6841 Body Mass Index (BMI) 40.0 and over, adult: Secondary | ICD-10-CM | POA: Diagnosis not present

## 2019-12-17 DIAGNOSIS — N760 Acute vaginitis: Secondary | ICD-10-CM | POA: Diagnosis not present

## 2019-12-17 LAB — HM MAMMOGRAPHY

## 2019-12-19 ENCOUNTER — Other Ambulatory Visit: Payer: Self-pay

## 2019-12-28 DIAGNOSIS — R739 Hyperglycemia, unspecified: Secondary | ICD-10-CM | POA: Diagnosis not present

## 2019-12-28 DIAGNOSIS — Z7984 Long term (current) use of oral hypoglycemic drugs: Secondary | ICD-10-CM | POA: Diagnosis not present

## 2019-12-28 DIAGNOSIS — E1165 Type 2 diabetes mellitus with hyperglycemia: Secondary | ICD-10-CM | POA: Diagnosis not present

## 2019-12-28 DIAGNOSIS — E119 Type 2 diabetes mellitus without complications: Secondary | ICD-10-CM | POA: Diagnosis not present

## 2020-01-15 ENCOUNTER — Other Ambulatory Visit: Payer: Self-pay

## 2020-01-15 ENCOUNTER — Encounter: Payer: Self-pay | Admitting: Family Medicine

## 2020-01-15 ENCOUNTER — Ambulatory Visit (INDEPENDENT_AMBULATORY_CARE_PROVIDER_SITE_OTHER): Payer: BC Managed Care – PPO | Admitting: Family Medicine

## 2020-01-15 VITALS — BP 142/86 | HR 78 | Temp 98.3°F | Ht 62.0 in | Wt 310.0 lb

## 2020-01-15 DIAGNOSIS — E119 Type 2 diabetes mellitus without complications: Secondary | ICD-10-CM | POA: Diagnosis not present

## 2020-01-15 DIAGNOSIS — T887XXA Unspecified adverse effect of drug or medicament, initial encounter: Secondary | ICD-10-CM

## 2020-01-15 DIAGNOSIS — Z23 Encounter for immunization: Secondary | ICD-10-CM | POA: Diagnosis not present

## 2020-01-15 MED ORDER — METFORMIN HCL ER 750 MG PO TB24: 750.0000 mg | ORAL_TABLET | Freq: Two times a day (BID) | ORAL | 3 refills | Status: DC

## 2020-01-15 MED ORDER — METFORMIN HCL ER 750 MG PO TB24: 750.0000 mg | ORAL_TABLET | Freq: Every day | ORAL | 3 refills | Status: DC

## 2020-01-15 NOTE — Patient Instructions (Signed)

## 2020-01-15 NOTE — Progress Notes (Signed)
Established Patient Office Visit  Subjective:  Patient ID: Misty Salazar, female    DOB: 1980-02-21  Age: 39 y.o. MRN: [id number]  CC:  Chief Complaint  Patient presents with  . Follow-up    f/u from being seen in urgent care    HPI Misty Salazar presents for follow-up regarding diabetes.  She was diagnosed over a year ago and had been managing this fairly well with dietary change and exercise-until recently.  Her last A1c here was 6.5%.  Few weeks ago she developed some blurred vision along with some polyuria and polydipsia.  She went to urgent care and had elevated glucose there over 200.  This was following a home CBG reading of 285.  Unfortunately, her dietary compliance has been poor lately and her weight went up from 299 pounds to 310 pounds.  She has been followed by nutritionist and has a fairly good understanding of dietary factors.  She was placed at urgent care few weeks ago on Metformin 1000 mg twice daily.  She has had some GI side effects with abdominal cramping and loose stools.  Her A1c at the urgent care was 8.4%.  She is not taking any diabetic drugs previously.  Denies any recent fever.  Blurred vision symptoms have cleared since starting the Metformin.  Past Medical History:  Diagnosis Date  . Allergy   . Anemia   . Diabetes mellitus without complication ([place])   . Hyperthyroidism   . Thyroid disease    hyperthyroidism    Past Surgical History:  Procedure Laterality Date  . CHOLECYSTECTOMY  01/20/2011   Procedure: LAPAROSCOPIC CHOLECYSTECTOMY;  Surgeon: Judieth Keens, DO;  Location: [facility];  Service: General;  Laterality: N/A;  . KNEE SURGERY  08/2007, 10/2009   right knee- remove fibroma    Family History  Problem Relation Age of Onset  . Hypertension Mother   . Hyperlipidemia Mother   . Cancer Mother        uterine cancer  . Diabetes Father   . Hypertension Father   . Hyperlipidemia Father   . Cancer Maternal Grandmother         cervical  . Stroke Maternal Grandmother   . Stroke Maternal Grandfather   . Stroke Paternal Grandfather     Social History   Socioeconomic History  . Marital status: Married    Spouse name: Not on file  . Number of children: Not on file  . Years of education: Not on file  . Highest education level: Not on file  Occupational History  . Not on file  Tobacco Use  . Smoking status: Never Smoker  . Smokeless tobacco: Never Used  Vaping Use  . Vaping Use: Never used  Substance and Sexual Activity  . Alcohol use: Yes    Alcohol/week: 0.0 standard drinks    Comment: occasional   . Drug use: No  . Sexual activity: Not on file  Other Topics Concern  . Not on file  Social History Narrative  . Not on file   Social Determinants of Health   Financial Resource Strain:   . Difficulty of Paying Living Expenses: Not on file  Food Insecurity:   . Worried About Charity fundraiser in the Last Year: Not on file  . Ran Out of Food in the Last Year: Not on file  Transportation Needs:   . Lack of Transportation (Medical): Not on file  . Lack of Transportation (Non-Medical): Not on file  Physical Activity:   .  Days of Exercise per Week: Not on file  . Minutes of Exercise per Session: Not on file  Stress:   . Feeling of Stress : Not on file  Social Connections:   . Frequency of Communication with Friends and Family: Not on file  . Frequency of Social Gatherings with Friends and Family: Not on file  . Attends Religious Services: Not on file  . Active Member of Clubs or Organizations: Not on file  . Attends Archivist Meetings: Not on file  . Marital Status: Not on file  Intimate Partner Violence:   . Fear of Current or Ex-Partner: Not on file  . Emotionally Abused: Not on file  . Physically Abused: Not on file  . Sexually Abused: Not on file    Outpatient Medications Prior to Visit  Medication Sig Dispense Refill  . CAMILA 0.35 MG tablet     . cetirizine (ZYRTEC) 10 MG  tablet Take 10 mg by mouth daily as needed. Allergies     . cyclobenzaprine (FLEXERIL) 10 MG tablet Take 1 tablet (10 mg total) by mouth at bedtime. 30 tablet 0  . IRON PO Take 1 tablet by mouth daily.     . Multiple Vitamin (MULTIVITAMIN) tablet Take 1 tablet by mouth daily.    . metFORMIN (GLUCOPHAGE) 1000 MG tablet Take by mouth.     No facility-administered medications prior to visit.    Allergies  Allergen Reactions  . Oxycodone-Acetaminophen Itching and Hives  . Percocet [Oxycodone-Acetaminophen] Hives and Itching    ROS Review of Systems  Constitutional: Negative for chills, fatigue and fever.  Eyes: Negative for visual disturbance.  Respiratory: Negative for cough, chest tightness, shortness of breath and wheezing.   Cardiovascular: Negative for chest pain, palpitations and leg swelling.  Endocrine: Negative for polydipsia and polyuria.  Neurological: Negative for dizziness, seizures, syncope, weakness, light-headedness and headaches.      Objective:    Physical Exam Vitals reviewed.  Constitutional:      Appearance: Normal appearance.  Cardiovascular:     Rate and Rhythm: Normal rate and regular rhythm.  Pulmonary:     Effort: Pulmonary effort is normal.     Breath sounds: Normal breath sounds.  Neurological:     Mental Status: She is alert.     BP (!) 142/86   Pulse 78   Temp 98.3 F (36.8 C) (Oral)   Ht 5\' 2"  (1.575 m)   Wt (!) 310 lb (140.6 kg)   SpO2 99%   BMI 56.70 kg/m  Wt Readings from Last 3 Encounters:  01/15/20 (!) 310 lb (140.6 kg)  07/24/19 299 lb 6.4 oz (135.8 kg)  07/03/19 (!) 301 lb 11.2 oz (136.9 kg)     Health Maintenance Due  Topic Date Due  . Hepatitis C Screening  Never done  . FOOT EXAM  Never done  . COVID-19 Vaccine (1) Never done  . INFLUENZA VACCINE  09/08/2019    There are no preventive care reminders to display for this patient.  Lab Results  Component Value Date   TSH 0.83 04/17/2019   Lab Results   Component Value Date   WBC 7.2 04/17/2019   HGB 10.1 Repeated and verified X2. (L) 04/17/2019   HCT 31.0 (L) 04/17/2019   MCV 73.0 (L) 04/17/2019   PLT 385.0 04/17/2019   Lab Results  Component Value Date   NA 137 04/17/2019   K 4.1 04/17/2019   CO2 27 04/17/2019   GLUCOSE 111 (H) 04/17/2019  BUN 11 04/17/2019   CREATININE 0.72 04/17/2019   BILITOT 0.4 04/17/2019   ALKPHOS 72 04/17/2019   AST 10 04/17/2019   ALT 13 04/17/2019   PROT 8.0 04/17/2019   ALBUMIN 3.8 04/17/2019   CALCIUM 9.6 04/17/2019   GFR 108.80 04/17/2019   Lab Results  Component Value Date   CHOL 133 04/17/2019   Lab Results  Component Value Date   HDL 36.40 (L) 04/17/2019   Lab Results  Component Value Date   LDLCALC 80 04/17/2019   Lab Results  Component Value Date   TRIG 82.0 04/17/2019   Lab Results  Component Value Date   CHOLHDL 4 04/17/2019   Lab Results  Component Value Date   HGBA1C 6.5 (A) 07/24/2019      Assessment & Plan:   Problem List Items Addressed This Visit      Unprioritized   Morbid obesity ([place])   Relevant Medications   metFORMIN (GLUCOPHAGE-XR) 750 MG 24 hr tablet   Type 2 diabetes mellitus without complications ([facility]) - Primary   Relevant Medications   metFORMIN (GLUCOPHAGE-XR) 750 MG 24 hr tablet   Other Relevant Orders   POCT glycosylated hemoglobin (Hb A1C)    Type 2 diabetes with recent exacerbation as above with A1c increased from 6.5% 8.4%.  This parallels her recent weight gain.  Blood sugars have improved since starting Metformin.  She is having GI side effects from the Metformin  -Changed to Metformin extended release 750 mg 1 twice daily -Tighten up diet and increase exercise -Recommend 69-month follow-up and reassess A1c.  If not further to goal at that point consider either SGLT2 or GLP-1 medication    Follow-up: Return in about 3 months (around 04/14/2020).    Carolann Littler, MD

## 2020-01-30 DIAGNOSIS — S61215A Laceration without foreign body of left ring finger without damage to nail, initial encounter: Secondary | ICD-10-CM | POA: Diagnosis not present

## 2020-02-08 DIAGNOSIS — U071 COVID-19: Secondary | ICD-10-CM

## 2020-02-08 HISTORY — DX: COVID-19: U07.1

## 2020-02-20 DIAGNOSIS — Z4802 Encounter for removal of sutures: Secondary | ICD-10-CM | POA: Diagnosis not present

## 2020-03-10 ENCOUNTER — Encounter: Payer: Self-pay | Admitting: Family Medicine

## 2020-04-21 ENCOUNTER — Other Ambulatory Visit: Payer: Self-pay

## 2020-04-22 ENCOUNTER — Encounter: Payer: Self-pay | Admitting: Family Medicine

## 2020-04-22 ENCOUNTER — Ambulatory Visit (INDEPENDENT_AMBULATORY_CARE_PROVIDER_SITE_OTHER): Payer: BC Managed Care – PPO | Admitting: Family Medicine

## 2020-04-22 VITALS — BP 130/70 | HR 90 | Temp 97.8°F | Ht 62.0 in | Wt 311.5 lb

## 2020-04-22 DIAGNOSIS — Z0000 Encounter for general adult medical examination without abnormal findings: Secondary | ICD-10-CM

## 2020-04-22 DIAGNOSIS — E119 Type 2 diabetes mellitus without complications: Secondary | ICD-10-CM

## 2020-04-22 DIAGNOSIS — Z1159 Encounter for screening for other viral diseases: Secondary | ICD-10-CM | POA: Diagnosis not present

## 2020-04-22 LAB — LIPID PANEL
Cholesterol: 123 mg/dL (ref 0–200)
HDL: 39.6 mg/dL (ref >39.00)
LDL Cholesterol: 59 mg/dL (ref 0–99)
NonHDL: 83.87
Total CHOL/HDL Ratio: 3
Triglycerides: 123 mg/dL (ref 0.0–149.0)
VLDL: 24.6 mg/dL (ref 0.0–40.0)

## 2020-04-22 LAB — BASIC METABOLIC PANEL
BUN: 7 mg/dL (ref 6–23)
CO2: 28 mEq/L (ref 19–32)
Calcium: 9.6 mg/dL (ref 8.4–10.5)
Chloride: 102 mEq/L (ref 96–112)
Creatinine, Ser: 0.61 mg/dL (ref 0.40–1.20)
GFR: 111.7 mL/min (ref >60.00)
Glucose, Bld: 110 mg/dL — ABNORMAL HIGH (ref 70–99)
Potassium: 4.1 mEq/L (ref 3.5–5.1)
Sodium: 137 mEq/L (ref 135–145)

## 2020-04-22 LAB — TSH: TSH: 1.3 u[IU]/mL (ref 0.35–4.50)

## 2020-04-22 LAB — CBC WITH DIFFERENTIAL/PLATELET
Basophils Absolute: 0 10*3/uL (ref 0.0–0.1)
Basophils Relative: 0.5 % (ref 0.0–3.0)
Eosinophils Absolute: 0.2 10*3/uL (ref 0.0–0.7)
Eosinophils Relative: 2.2 % (ref 0.0–5.0)
HCT: 33.9 % — ABNORMAL LOW (ref 36.0–46.0)
Hemoglobin: 11.5 g/dL — ABNORMAL LOW (ref 12.0–15.0)
Lymphocytes Relative: 34.2 % (ref 12.0–46.0)
Lymphs Abs: 2.4 10*3/uL (ref 0.7–4.0)
MCHC: 33.9 g/dL (ref 30.0–36.0)
MCV: 79.1 fl (ref 78.0–100.0)
Monocytes Absolute: 0.4 10*3/uL (ref 0.1–1.0)
Monocytes Relative: 5.3 % (ref 3.0–12.0)
Neutro Abs: 4 10*3/uL (ref 1.4–7.7)
Neutrophils Relative %: 57.8 % (ref 43.0–77.0)
Platelets: 286 10*3/uL (ref 150.0–400.0)
RBC: 4.29 Mil/uL (ref 3.87–5.11)
RDW: 15.2 % (ref 11.5–15.5)
WBC: 6.9 10*3/uL (ref 4.0–10.5)

## 2020-04-22 LAB — HEPATIC FUNCTION PANEL
ALT: 14 U/L (ref 0–35)
AST: 11 U/L (ref 0–37)
Albumin: 4 g/dL (ref 3.5–5.2)
Alkaline Phosphatase: 60 U/L (ref 39–117)
Bilirubin, Direct: 0.1 mg/dL (ref 0.0–0.3)
Total Bilirubin: 0.6 mg/dL (ref 0.2–1.2)
Total Protein: 8 g/dL (ref 6.0–8.3)

## 2020-04-22 LAB — POCT GLYCOSYLATED HEMOGLOBIN (HGB A1C): Hemoglobin A1C: 7.4 % — AB (ref 4.0–5.6)

## 2020-04-22 LAB — MICROALBUMIN / CREATININE URINE RATIO
Creatinine,U: 78.7 mg/dL
Microalb Creat Ratio: 10 mg/g (ref 0.0–30.0)
Microalb, Ur: 7.9 mg/dL — ABNORMAL HIGH (ref 0.0–1.9)

## 2020-04-22 MED ORDER — RYBELSUS 3 MG PO TABS: 3.0000 mg | ORAL_TABLET | Freq: Every day | ORAL | 0 refills | Status: DC

## 2020-04-22 NOTE — Progress Notes (Signed)
Established Patient Office Visit  Subjective:  Patient ID: Misty Salazar, female    DOB: 1980/11/13  Age: 40 y.o. MRN: [id number]  CC:  Chief Complaint  Patient presents with  . Annual Exam    HPI [name] presents for physical exam.  She sees gynecologist and had Pap smear last fall.  She has also had baseline mammogram.  She does have type 2 diabetes.  A1c was not well controlled last check.  We started her on extended release Metformin after she did not tolerate immediate release.  She is not checking blood sugars regularly.  She has resumed exercising more.  She is also made some dietary changes though her weight is unchanged.  Health maintenance reviewed.  -COVID vaccines up-to-date -She had previous Pneumovax -Tetanus up-to-date -Flu vaccine already given -Pap smear up-to-date  Family history-mother had uterine cancer.  She also has history of type 2 diabetes hypertension.  Her father has hypertension diabetes history as well.  She has a brother with hypertension.  No sisters.  Social history-she is married.  No children.  Never smoked.  She works with a company does [employer].  Rare alcohol use.  Surgical history-she has had previous cholecystectomy.  Also prior history of arthroscopic knee surgery  Past Medical History:  Diagnosis Date  . Allergy   . Anemia   . Diabetes mellitus without complication ([place])   . Hyperthyroidism   . Thyroid disease    hyperthyroidism    Past Surgical History:  Procedure Laterality Date  . CHOLECYSTECTOMY  01/20/2011   Procedure: LAPAROSCOPIC CHOLECYSTECTOMY;  Surgeon: Judieth Keens, DO;  Location: [facility];  Service: General;  Laterality: N/A;  . KNEE SURGERY  08/2007, 10/2009   right knee- remove fibroma    Family History  Problem Relation Age of Onset  . Hypertension Mother   . Hyperlipidemia Mother   . Cancer Mother        uterine cancer  . Diabetes Mother   . Diabetes Father   .  Hypertension Father   . Hyperlipidemia Father   . Cancer Maternal Grandmother        cervical  . Stroke Maternal Grandmother   . Stroke Maternal Grandfather   . Stroke Paternal Grandfather   . Hypertension Brother     Social History   Socioeconomic History  . Marital status: Married    Spouse name: Not on file  . Number of children: Not on file  . Years of education: Not on file  . Highest education level: Not on file  Occupational History  . Not on file  Tobacco Use  . Smoking status: Never Smoker  . Smokeless tobacco: Never Used  Vaping Use  . Vaping Use: Never used  Substance and Sexual Activity  . Alcohol use: Yes    Alcohol/week: 0.0 standard drinks    Comment: occasional   . Drug use: No  . Sexual activity: Not on file  Other Topics Concern  . Not on file  Social History Narrative  . Not on file   Social Determinants of Health   Financial Resource Strain: Not on file  Food Insecurity: Not on file  Transportation Needs: Not on file  Physical Activity: Not on file  Stress: Not on file  Social Connections: Not on file  Intimate Partner Violence: Not on file    Outpatient Medications Prior to Visit  Medication Sig Dispense Refill  . CAMILA 0.35 MG tablet     . cetirizine (  ZYRTEC) 10 MG tablet Take 10 mg by mouth daily as needed. Allergies     . cyclobenzaprine (FLEXERIL) 10 MG tablet Take 1 tablet (10 mg total) by mouth at bedtime. 30 tablet 0  . IRON PO Take 1 tablet by mouth daily.    . metFORMIN (GLUCOPHAGE-XR) 750 MG 24 hr tablet Take 1 tablet (750 mg total) by mouth in the morning and at bedtime. 180 tablet 3  . Multiple Vitamin (MULTIVITAMIN) tablet Take 1 tablet by mouth daily.     No facility-administered medications prior to visit.    Allergies  Allergen Reactions  . Oxycodone-Acetaminophen Itching and Hives  . Percocet [Oxycodone-Acetaminophen] Hives and Itching    ROS Review of Systems  Constitutional: Negative for activity change,  appetite change, fatigue, fever and unexpected weight change.  HENT: Negative for ear pain, hearing loss, sore throat and trouble swallowing.   Eyes: Negative for visual disturbance.  Respiratory: Negative for cough and shortness of breath.   Cardiovascular: Negative for chest pain and palpitations.  Gastrointestinal: Negative for abdominal pain, blood in stool, constipation and diarrhea.  Genitourinary: Negative for dysuria and hematuria.  Musculoskeletal: Negative for arthralgias, back pain and myalgias.  Skin: Negative for rash.  Neurological: Negative for dizziness, syncope and headaches.  Hematological: Negative for adenopathy.  Psychiatric/Behavioral: Negative for confusion and dysphoric mood.      Objective:    Physical Exam Constitutional:      Appearance: She is well-developed.  HENT:     Head: Normocephalic and atraumatic.  Eyes:     Pupils: Pupils are equal, round, and reactive to light.  Neck:     Thyroid: No thyromegaly.  Cardiovascular:     Rate and Rhythm: Normal rate and regular rhythm.     Heart sounds: Normal heart sounds. No murmur heard.   Pulmonary:     Effort: No respiratory distress.     Breath sounds: Normal breath sounds. No wheezing or rales.  Abdominal:     General: Bowel sounds are normal. There is no distension.     Palpations: Abdomen is soft. There is no mass.     Tenderness: There is no abdominal tenderness. There is no guarding or rebound.  Musculoskeletal:        General: Normal range of motion.     Cervical back: Normal range of motion and neck supple.  Lymphadenopathy:     Cervical: No cervical adenopathy.  Skin:    Findings: No rash.     Comments: Feet reveal no skin lesions. Good distal foot pulses. Good capillary refill. No calluses. Normal sensation with monofilament testing   Neurological:     Mental Status: She is alert and oriented to person, place, and time.     Cranial Nerves: No cranial nerve deficit.     Deep Tendon  Reflexes: Reflexes normal.  Psychiatric:        Behavior: Behavior normal.        Thought Content: Thought content normal.        Judgment: Judgment normal.     BP 130/70   Pulse 90   Temp 97.8 F (36.6 C) (Oral)   Ht 5\' 2"  (1.575 m)   Wt (!) 311 lb 8 oz (141.3 kg)   SpO2 98%   BMI 56.97 kg/m  Wt Readings from Last 3 Encounters:  04/22/20 (!) 311 lb 8 oz (141.3 kg)  01/15/20 (!) 310 lb (140.6 kg)  07/24/19 299 lb 6.4 oz (135.8 kg)     Health  Maintenance Due  Topic Date Due  . Hepatitis C Screening  Never done  . FOOT EXAM  Never done  . URINE MICROALBUMIN  04/16/2020    There are no preventive care reminders to display for this patient.  Lab Results  Component Value Date   TSH 0.83 04/17/2019   Lab Results  Component Value Date   WBC 7.2 04/17/2019   HGB 10.1 Repeated and verified X2. (L) 04/17/2019   HCT 31.0 (L) 04/17/2019   MCV 73.0 (L) 04/17/2019   PLT 385.0 04/17/2019   Lab Results  Component Value Date   NA 137 04/17/2019   K 4.1 04/17/2019   CO2 27 04/17/2019   GLUCOSE 111 (H) 04/17/2019   BUN 11 04/17/2019   CREATININE 0.72 04/17/2019   BILITOT 0.4 04/17/2019   ALKPHOS 72 04/17/2019   AST 10 04/17/2019   ALT 13 04/17/2019   PROT 8.0 04/17/2019   ALBUMIN 3.8 04/17/2019   CALCIUM 9.6 04/17/2019   GFR 108.80 04/17/2019   Lab Results  Component Value Date   CHOL 133 04/17/2019   Lab Results  Component Value Date   HDL 36.40 (L) 04/17/2019   Lab Results  Component Value Date   LDLCALC 80 04/17/2019   Lab Results  Component Value Date   TRIG 82.0 04/17/2019   Lab Results  Component Value Date   CHOLHDL 4 04/17/2019   Lab Results  Component Value Date   HGBA1C 7.4 (A) 04/22/2020      Assessment & Plan:   Problem List Items Addressed This Visit      Unprioritized   Type 2 diabetes mellitus without complications ([place]) - Primary   Relevant Medications   Semaglutide (RYBELSUS) 3 MG TABS   Other Relevant Orders   POC HgB A1c  (Completed)    Other Visit Diagnoses    Physical exam       Relevant Orders   Basic metabolic panel   Lipid panel   CBC with Differential/Platelet   TSH   Hepatic function panel   Hep C Antibody   Microalbumin / creatinine urine ratio    -We congratulated her on her exercise habits and improved A1c of 7.4%. -We did discuss option of considering Rybelsus.  Feels she would benefit from GLP-1 medication both for weight loss and improve glycemic control.  If we get this covered start 3 mg once daily for 1 month and then our plan will be to titrate to 7 mg in a month if she is tolerating well.  She denies any contraindications.  No history of pancreatitis.  No family history of medullary thyroid cancer. -Could also consider SGLT medication if not covered GLP-1 by insurance. -Set up 37-month follow-up -Check labs including urine microalbumin screen.  Continue yearly eye exam. -She will continue with GYN regarding her Pap smears and mammograms.  Meds ordered this encounter  Medications  . Semaglutide (RYBELSUS) 3 MG TABS    Sig: Take 3 mg by mouth daily.    Dispense:  30 tablet    Refill:  0    Follow-up: Return in about 4 months (around 08/22/2020).    Carolann Littler, MD

## 2020-04-22 NOTE — Patient Instructions (Signed)
Diabetes Mellitus and Nutrition, Adult When you have diabetes, or diabetes mellitus, it is very important to have healthy eating habits because your blood sugar (glucose) levels are greatly affected by what you eat and drink. Eating healthy foods in the right amounts, at about the same times every day, can help you:  Control your blood glucose.  Lower your risk of heart disease.  Improve your blood pressure.  Reach or maintain a healthy weight. What can affect my meal plan? Every person with diabetes is different, and each person has different needs for a meal plan. Your health care provider may recommend that you work with a dietitian to make a meal plan that is best for you. Your meal plan may vary depending on factors such as:  The calories you need.  The medicines you take.  Your weight.  Your blood glucose, blood pressure, and cholesterol levels.  Your activity level.  Other health conditions you have, such as heart or kidney disease. How do carbohydrates affect me? Carbohydrates, also called carbs, affect your blood glucose level more than any other type of food. Eating carbs naturally raises the amount of glucose in your blood. Carb counting is a method for keeping track of how many carbs you eat. Counting carbs is important to keep your blood glucose at a healthy level, especially if you use insulin or take certain oral diabetes medicines. It is important to know how many carbs you can safely have in each meal. This is different for every person. Your dietitian can help you calculate how many carbs you should have at each meal and for each snack. How does alcohol affect me? Alcohol can cause a sudden decrease in blood glucose (hypoglycemia), especially if you use insulin or take certain oral diabetes medicines. Hypoglycemia can be a life-threatening condition. Symptoms of hypoglycemia, such as sleepiness, dizziness, and confusion, are similar to symptoms of having too much  alcohol.  Do not drink alcohol if: ? Your health care provider tells you not to drink. ? You are pregnant, may be pregnant, or are planning to become pregnant.  If you drink alcohol: ? Do not drink on an empty stomach. ? Limit how much you use to:  0-1 drink a day for women.  0-2 drinks a day for men. ? Be aware of how much alcohol is in your drink. In the [place] one drink equals one 12 oz bottle of beer (355 mL), one 5 oz glass of wine (148 mL), or one 1 oz glass of hard liquor (44 mL). ? Keep yourself hydrated with water, diet soda, or unsweetened iced tea.  Keep in mind that regular soda, juice, and other mixers may contain a lot of sugar and must be counted as carbs. What are tips for following this plan? Reading food labels  Start by checking the serving size on the "Nutrition Facts" label of packaged foods and drinks. The amount of calories, carbs, fats, and other nutrients listed on the label is based on one serving of the item. Many items contain more than one serving per package.  Check the total grams (g) of carbs in one serving. You can calculate the number of servings of carbs in one serving by dividing the total carbs by 15. For example, if a food has 30 g of total carbs per serving, it would be equal to 2 servings of carbs.  Check the number of grams (g) of saturated fats and trans fats in one serving. Choose foods that have   a low amount or none of these fats.  Check the number of milligrams (mg) of salt (sodium) in one serving. Most people should limit total sodium intake to less than 2,300 mg per day.  Always check the nutrition information of foods labeled as "low-fat" or "nonfat." These foods may be higher in added sugar or refined carbs and should be avoided.  Talk to your dietitian to identify your daily goals for nutrients listed on the label. Shopping  Avoid buying canned, pre-made, or processed foods. These foods tend to be high in fat, sodium, and added  sugar.  Shop around the outside edge of the grocery store. This is where you will most often find fresh fruits and vegetables, bulk grains, fresh meats, and fresh dairy. Cooking  Use low-heat cooking methods, such as baking, instead of high-heat cooking methods like deep frying.  Cook using healthy oils, such as olive, canola, or sunflower oil.  Avoid cooking with butter, cream, or high-fat meats. Meal planning  Eat meals and snacks regularly, preferably at the same times every day. Avoid going long periods of time without eating.  Eat foods that are high in fiber, such as fresh fruits, vegetables, beans, and whole grains. Talk with your dietitian about how many servings of carbs you can eat at each meal.  Eat 4-6 oz (112-168 g) of lean protein each day, such as lean meat, chicken, fish, eggs, or tofu. One ounce (oz) of lean protein is equal to: ? 1 oz (28 g) of meat, chicken, or fish. ? 1 egg. ?  cup (62 g) of tofu.  Eat some foods each day that contain healthy fats, such as avocado, nuts, seeds, and fish.   What foods should I eat? Fruits Berries. Apples. Oranges. Peaches. Apricots. Plums. Grapes. Mango. Papaya. Pomegranate. Kiwi. Cherries. Vegetables Lettuce. Spinach. Leafy greens, including kale, chard, collard greens, and mustard greens. Beets. Cauliflower. Cabbage. Broccoli. Carrots. Green beans. Tomatoes. Peppers. Onions. Cucumbers. Brussels sprouts. Grains Whole grains, such as whole-wheat or whole-grain bread, crackers, tortillas, cereal, and pasta. Unsweetened oatmeal. Quinoa. Brown or wild rice. Meats and other proteins Seafood. Poultry without skin. Lean cuts of poultry and beef. Tofu. Nuts. Seeds. Dairy Low-fat or fat-free dairy products such as milk, yogurt, and cheese. The items listed above may not be a complete list of foods and beverages you can eat. Contact a dietitian for more information. What foods should I avoid? Fruits Fruits canned with  syrup. Vegetables Canned vegetables. Frozen vegetables with butter or cream sauce. Grains Refined white flour and flour products such as bread, pasta, snack foods, and cereals. Avoid all processed foods. Meats and other proteins Fatty cuts of meat. Poultry with skin. Breaded or fried meats. Processed meat. Avoid saturated fats. Dairy Full-fat yogurt, cheese, or milk. Beverages Sweetened drinks, such as soda or iced tea. The items listed above may not be a complete list of foods and beverages you should avoid. Contact a dietitian for more information. Questions to ask a health care provider  Do I need to meet with a diabetes educator?  Do I need to meet with a dietitian?  What number can I call if I have questions?  When are the best times to check my blood glucose? Where to find more information:  American Diabetes Association: diabetes.org  Academy of Nutrition and Dietetics: www.eatright.org  National Institute of Diabetes and Digestive and Kidney Diseases: www.niddk.nih.gov  Association of Diabetes Care and Education Specialists: www.diabeteseducator.org Summary  It is important to have healthy eating   habits because your blood sugar (glucose) levels are greatly affected by what you eat and drink.  A healthy meal plan will help you control your blood glucose and maintain a healthy lifestyle.  Your health care provider may recommend that you work with a dietitian to make a meal plan that is best for you.  Keep in mind that carbohydrates (carbs) and alcohol have immediate effects on your blood glucose levels. It is important to count carbs and to use alcohol carefully. This information is not intended to replace advice given to you by your health care provider. Make sure you discuss any questions you have with your health care provider. Document Revised: 01/01/2019 Document Reviewed: 01/01/2019 Elsevier Patient Education  2021 [place] me some feedback in one  month regarding the Rybelsus and if tolerating we will titrate up at that time.

## 2020-04-23 LAB — HEPATITIS C ANTIBODY
Hepatitis C Ab: NONREACTIVE
SIGNAL TO CUT-OFF: 0.03 (ref <1.00)

## 2020-05-11 ENCOUNTER — Encounter: Payer: Self-pay | Admitting: Family Medicine

## 2020-05-11 NOTE — Telephone Encounter (Signed)
Ok for refills

## 2020-05-12 MED ORDER — RYBELSUS 7 MG PO TABS: 7.0000 mg | ORAL_TABLET | Freq: Every day | ORAL | 0 refills | Status: DC

## 2020-07-28 ENCOUNTER — Telehealth: Payer: Self-pay | Admitting: Family Medicine

## 2020-07-28 NOTE — Telephone Encounter (Signed)
Spoke with the patient. A virtual appointment has been made. Nothing further needed.

## 2020-07-28 NOTE — Telephone Encounter (Signed)
Pt is calling in stating that she just tested positive for COVID about 5 mins ago and the only symptoms that she is having is runny/stuffy nose and coughing.  Pt would like to know if there is something special that she should be doing since she is a diabetic.  Pt would like to have a call back.

## 2020-07-28 NOTE — Telephone Encounter (Signed)
Called and spoke with the patient. Phone line was disconnected. ATC back and received no answer, unable to leave a message. She will need a virtual appointment with Dr. Elease Hashimoto

## 2020-07-29 ENCOUNTER — Telehealth (INDEPENDENT_AMBULATORY_CARE_PROVIDER_SITE_OTHER): Payer: BC Managed Care – PPO | Admitting: Family Medicine

## 2020-07-29 ENCOUNTER — Other Ambulatory Visit: Payer: Self-pay

## 2020-07-29 DIAGNOSIS — U071 COVID-19: Secondary | ICD-10-CM | POA: Diagnosis not present

## 2020-07-29 NOTE — Progress Notes (Signed)
Patient ID: Misty Salazar, female   DOB: 1981-01-20, 40 y.o.   MRN: [id number]  This visit type was conducted due to national recommendations for restrictions regarding the COVID-19 pandemic in an effort to limit this patient's exposure and mitigate transmission in our community.   Virtual Visit via Video Note  I connected with Misty Salazar on 07/29/20 at  3:45 PM EDT by a video enabled telemedicine application and verified that I am speaking with the correct person using two identifiers.  Location patient: home Location provider:work or home office Persons participating in the virtual visit: patient, provider  I discussed the limitations of evaluation and management by telemedicine and the availability of in person appointments. The patient expressed understanding and agreed to proceed.   HPI:  Misty Salazar has COVID-19 infection.  She and her husband traveled last weekend.  She developed symptoms Monday of some nasal congestion, cough, and fatigue.  No sore throat symptoms.  Symptoms are relatively mild.  Mild aches.  Her husband is asymptomatic and tested negative.  She tested positive yesterday.  She has had Moderna vaccine with 1 booster.  She works from home already.  Has been isolated.  No nausea or vomiting.  She does have history of morbid obesity and type 2 diabetes but no chronic lung disease   ROS: See pertinent positives and negatives per HPI.  Past Medical History:  Diagnosis Date   Allergy    Anemia    Diabetes mellitus without complication ([place])    Hyperthyroidism    Thyroid disease    hyperthyroidism    Past Surgical History:  Procedure Laterality Date   CHOLECYSTECTOMY  01/20/2011   Procedure: LAPAROSCOPIC CHOLECYSTECTOMY;  Surgeon: Judieth Keens, DO;  Location: [facility];  Service: General;  Laterality: N/A;   KNEE SURGERY  08/2007, 10/2009   right knee- remove fibroma    Family History  Problem Relation Age of Onset   Hypertension Mother     Hyperlipidemia Mother    Cancer Mother        uterine cancer   Diabetes Mother    Diabetes Father    Hypertension Father    Hyperlipidemia Father    Cancer Maternal Grandmother        cervical   Stroke Maternal Grandmother    Stroke Maternal Grandfather    Stroke Paternal Grandfather    Hypertension Brother     SOCIAL HX: Non-smoker   Current Outpatient Medications:    CAMILA 0.35 MG tablet, , Disp: , Rfl:    cetirizine (ZYRTEC) 10 MG tablet, Take 10 mg by mouth daily as needed. Allergies , Disp: , Rfl:    cyclobenzaprine (FLEXERIL) 10 MG tablet, Take 1 tablet (10 mg total) by mouth at bedtime., Disp: 30 tablet, Rfl: 0   IRON PO, Take 1 tablet by mouth daily., Disp: , Rfl:    Multiple Vitamin (MULTIVITAMIN) tablet, Take 1 tablet by mouth daily., Disp: , Rfl:    Semaglutide (RYBELSUS) 7 MG TABS, Take 7 mg by mouth daily., Disp: 90 tablet, Rfl: 0  EXAM:  VITALS per patient if applicable:  GENERAL: alert, oriented, appears well and in no acute distress  HEENT: atraumatic, conjunttiva clear, no obvious abnormalities on inspection of external nose and ears  NECK: normal movements of the head and neck  LUNGS: on inspection no signs of respiratory distress, breathing rate appears normal, no obvious gross SOB, gasping or wheezing  CV: no obvious cyanosis  MS: moves all visible extremities without noticeable abnormality  PSYCH/NEURO: pleasant and cooperative, no obvious depression or anxiety, speech and thought processing grossly intact  ASSESSMENT AND PLAN:  Discussed the following assessment and plan:  COVID-19 infection.  Onset of symptoms 2 days ago.  Positive test yesterday.  Patient vaccinated.  Symptoms are mild at this time.  -Recommend plenty of fluids and rest -We discussed antiviral therapy but of not recommended at this time since her symptoms are mild.  She knows to follow-up promptly for any increased fever, dyspnea, or other concerns -We reviewed CDC  guidelines for isolation     I discussed the assessment and treatment plan with the patient. The patient was provided an opportunity to ask questions and all were answered. The patient agreed with the plan and demonstrated an understanding of the instructions.   The patient was advised to call back or seek an in-person evaluation if the symptoms worsen or if the condition fails to improve as anticipated.     Carolann Littler, MD

## 2020-08-11 ENCOUNTER — Other Ambulatory Visit: Payer: Self-pay | Admitting: Family Medicine

## 2020-08-21 ENCOUNTER — Ambulatory Visit (INDEPENDENT_AMBULATORY_CARE_PROVIDER_SITE_OTHER): Payer: BC Managed Care – PPO | Admitting: Family Medicine

## 2020-08-21 ENCOUNTER — Other Ambulatory Visit: Payer: Self-pay

## 2020-08-21 VITALS — BP 130/68 | HR 62 | Temp 98.3°F | Wt 315.0 lb

## 2020-08-21 DIAGNOSIS — E119 Type 2 diabetes mellitus without complications: Secondary | ICD-10-CM | POA: Diagnosis not present

## 2020-08-21 LAB — POCT GLYCOSYLATED HEMOGLOBIN (HGB A1C): Hemoglobin A1C: 6.3 % — AB (ref 4.0–5.6)

## 2020-08-21 NOTE — Progress Notes (Signed)
Established Patient Office Visit  Subjective:  Patient ID: Misty Salazar, female    DOB: [birth year]  Age: 40 y.o. MRN: [id number]  CC:  Chief Complaint  Patient presents with   Follow-up    HPI Misty Salazar presents for follow-up type 2 diabetes.  Last A1c was 7.4%.  We started Rybelsus.  She is currently on 14 mg daily and tolerating with no side effects.  Fasting blood sugars have been excellent.  She had 113 this morning which she states is the highest its been in some time.  She has some earlier satiety but no significant side effects such as nausea.  She and her husband are considering pregnancy at some point but not yet.  She does take oral contraception currently.  She previously had issues with metformin immediate release with some GI side effects but could tolerate extended release.  She had recent COVID infection and feels fully recovered at this time.  Past Medical History:  Diagnosis Date   Allergy    Anemia    Diabetes mellitus without complication ([place])    Hyperthyroidism    Thyroid disease    hyperthyroidism    Past Surgical History:  Procedure Laterality Date   CHOLECYSTECTOMY  01/20/2011   Procedure: LAPAROSCOPIC CHOLECYSTECTOMY;  Surgeon: Judieth Keens, DO;  Location: [facility];  Service: General;  Laterality: N/A;   KNEE SURGERY  08/2007, 10/2009   right knee- remove fibroma    Family History  Problem Relation Age of Onset   Hypertension Mother    Hyperlipidemia Mother    Cancer Mother        uterine cancer   Diabetes Mother    Diabetes Father    Hypertension Father    Hyperlipidemia Father    Cancer Maternal Grandmother        cervical   Stroke Maternal Grandmother    Stroke Maternal Grandfather    Stroke Paternal Grandfather    Hypertension Brother     Social History   Socioeconomic History   Marital status: Married    Spouse name: Not on file   Number of children: Not on file   Years of education: Not on file    Highest education level: Not on file  Occupational History   Not on file  Tobacco Use   Smoking status: Never   Smokeless tobacco: Never  Vaping Use   Vaping Use: Never used  Substance and Sexual Activity   Alcohol use: Yes    Alcohol/week: 0.0 standard drinks    Comment: occasional    Drug use: No   Sexual activity: Not on file  Other Topics Concern   Not on file  Social History Narrative   Not on file   Social Determinants of Health   Financial Resource Strain: Not on file  Food Insecurity: Not on file  Transportation Needs: Not on file  Physical Activity: Not on file  Stress: Not on file  Social Connections: Not on file  Intimate Partner Violence: Not on file    Outpatient Medications Prior to Visit  Medication Sig Dispense Refill   CAMILA 0.35 MG tablet      cetirizine (ZYRTEC) 10 MG tablet Take 10 mg by mouth daily as needed. Allergies      cyclobenzaprine (FLEXERIL) 10 MG tablet Take 1 tablet (10 mg total) by mouth at bedtime. 30 tablet 0   IRON PO Take 1 tablet by mouth daily.     Multiple Vitamin (MULTIVITAMIN) tablet Take 1 tablet  by mouth daily.     RYBELSUS 7 MG TABS TAKE 1 TABLET BY MOUTH EVERY DAY 90 tablet 0   No facility-administered medications prior to visit.    Allergies  Allergen Reactions   Oxycodone-Acetaminophen Itching and Hives   Percocet [Oxycodone-Acetaminophen] Hives and Itching    ROS Review of Systems  Constitutional:  Negative for chills and fever.  Endocrine: Negative for polydipsia and polyuria.     Objective:    Physical Exam Vitals reviewed.  Cardiovascular:     Rate and Rhythm: Normal rate and regular rhythm.  Pulmonary:     Effort: Pulmonary effort is normal.     Breath sounds: Normal breath sounds.  Neurological:     Mental Status: She is alert.    BP 130/68 (BP Location: Left Wrist, Patient Position: Sitting, Cuff Size: Normal)   Pulse 62   Temp 98.3 F (36.8 C) (Oral)   Wt (!) 315 lb (142.9 kg)   SpO2 98%    BMI 57.61 kg/m  Wt Readings from Last 3 Encounters:  08/21/20 (!) 315 lb (142.9 kg)  04/22/20 (!) 311 lb 8 oz (141.3 kg)  01/15/20 (!) 310 lb (140.6 kg)     Health Maintenance Due  Topic Date Due   Pneumococcal Vaccine 33-63 Years old (2 - PCV) 09/05/2018   COVID-19 Vaccine (4 - Booster for Moderna series) 04/07/2020    There are no preventive care reminders to display for this patient.  Lab Results  Component Value Date   TSH 1.30 04/22/2020   Lab Results  Component Value Date   WBC 6.9 04/22/2020   HGB 11.5 (L) 04/22/2020   HCT 33.9 (L) 04/22/2020   MCV 79.1 04/22/2020   PLT 286.0 04/22/2020   Lab Results  Component Value Date   NA 137 04/22/2020   K 4.1 04/22/2020   CO2 28 04/22/2020   GLUCOSE 110 (H) 04/22/2020   BUN 7 04/22/2020   CREATININE 0.61 04/22/2020   BILITOT 0.6 04/22/2020   ALKPHOS 60 04/22/2020   AST 11 04/22/2020   ALT 14 04/22/2020   PROT 8.0 04/22/2020   ALBUMIN 4.0 04/22/2020   CALCIUM 9.6 04/22/2020   GFR 111.70 04/22/2020   Lab Results  Component Value Date   CHOL 123 04/22/2020   Lab Results  Component Value Date   HDL 39.60 04/22/2020   Lab Results  Component Value Date   LDLCALC 59 04/22/2020   Lab Results  Component Value Date   TRIG 123.0 04/22/2020   Lab Results  Component Value Date   CHOLHDL 3 04/22/2020   Lab Results  Component Value Date   HGBA1C 6.3 (A) 08/21/2020      Assessment & Plan:   Type 2 diabetes improved with A1c today 6.3%.  -Continue Rybelsus 7 mg daily. -We did discuss the fact that if she and her husband are resolute regarding pregnancy that we would need to look at other options such as getting back on metformin if tolerated well. -We strongly encouraged her to get back in the gym and try to lose some weight.  We will plan on 6-month follow-up.   No orders of the defined types were placed in this encounter.   Follow-up: Return in about 3 months (around 11/21/2020).    Carolann Littler, MD

## 2020-08-21 NOTE — Patient Instructions (Signed)
A1C is improved to 6.3%!      Let's plan on 3 months follow up.

## 2020-09-07 ENCOUNTER — Ambulatory Visit (INDEPENDENT_AMBULATORY_CARE_PROVIDER_SITE_OTHER): Payer: BC Managed Care – PPO | Admitting: Family Medicine

## 2020-09-07 ENCOUNTER — Other Ambulatory Visit: Payer: Self-pay

## 2020-09-07 ENCOUNTER — Encounter: Payer: Self-pay | Admitting: Family Medicine

## 2020-09-07 VITALS — BP 132/80 | HR 107 | Temp 98.2°F | Wt 313.3 lb

## 2020-09-07 DIAGNOSIS — R0982 Postnasal drip: Secondary | ICD-10-CM

## 2020-09-07 DIAGNOSIS — H9202 Otalgia, left ear: Secondary | ICD-10-CM | POA: Diagnosis not present

## 2020-09-07 NOTE — Patient Instructions (Signed)
Consider trial of over the counter nasal spray such as Flonase or Nasacort.

## 2020-09-07 NOTE — Progress Notes (Signed)
Established Patient Office Visit  Subjective:  Patient ID: Misty Salazar, female    DOB: 07-24-1980  Age: 40 y.o. MRN: [id number]  CC:  Chief Complaint  Patient presents with   Ear Pain    L ear, x 1 week, sore feeling inside the ear and down the neck    HPI Misty Salazar presents for left ear pain for the past week.  Pain is relatively minimal at this time.  She states she had a little bit of crusted drainage few days ago but none now.  No bloody drainage.  No recent swimming.  No major change of altitude.  She does have frequent postnasal drip symptoms.  She takes antihistamine daily.  Has not tried any nasal steroids.  No hearing changes.  No cough.  No fever. She does have some intermittent pressure over her maxillary and frontal sinuses.  No recent purulent secretions.  Past Medical History:  Diagnosis Date   Allergy    Anemia    Diabetes mellitus without complication ([place])    Hyperthyroidism    Thyroid disease    hyperthyroidism    Past Surgical History:  Procedure Laterality Date   CHOLECYSTECTOMY  01/20/2011   Procedure: LAPAROSCOPIC CHOLECYSTECTOMY;  Surgeon: Judieth Keens, DO;  Location: [facility];  Service: General;  Laterality: N/A;   KNEE SURGERY  08/2007, 10/2009   right knee- remove fibroma    Family History  Problem Relation Age of Onset   Hypertension Mother    Hyperlipidemia Mother    Cancer Mother        uterine cancer   Diabetes Mother    Diabetes Father    Hypertension Father    Hyperlipidemia Father    Cancer Maternal Grandmother        cervical   Stroke Maternal Grandmother    Stroke Maternal Grandfather    Stroke Paternal Grandfather    Hypertension Brother     Social History   Socioeconomic History   Marital status: Married    Spouse name: Not on file   Number of children: Not on file   Years of education: Not on file   Highest education level: Not on file  Occupational History   Not on file  Tobacco Use    Smoking status: Never   Smokeless tobacco: Never  Vaping Use   Vaping Use: Never used  Substance and Sexual Activity   Alcohol use: Yes    Alcohol/week: 0.0 standard drinks    Comment: occasional    Drug use: No   Sexual activity: Not on file  Other Topics Concern   Not on file  Social History Narrative   Not on file   Social Determinants of Health   Financial Resource Strain: Not on file  Food Insecurity: Not on file  Transportation Needs: Not on file  Physical Activity: Not on file  Stress: Not on file  Social Connections: Not on file  Intimate Partner Violence: Not on file    Outpatient Medications Prior to Visit  Medication Sig Dispense Refill   CAMILA 0.35 MG tablet      cetirizine (ZYRTEC) 10 MG tablet Take 10 mg by mouth daily as needed. Allergies      cyclobenzaprine (FLEXERIL) 10 MG tablet Take 1 tablet (10 mg total) by mouth at bedtime. 30 tablet 0   IRON PO Take 1 tablet by mouth daily.     Multiple Vitamin (MULTIVITAMIN) tablet Take 1 tablet by mouth daily.  RYBELSUS 7 MG TABS TAKE 1 TABLET BY MOUTH EVERY DAY 90 tablet 0   No facility-administered medications prior to visit.    Allergies  Allergen Reactions   Oxycodone-Acetaminophen Itching and Hives   Percocet [Oxycodone-Acetaminophen] Hives and Itching    ROS Review of Systems  Constitutional:  Negative for chills and fever.  HENT:  Positive for ear pain and postnasal drip. Negative for hearing loss.   Respiratory:  Negative for cough.      Objective:    Physical Exam Vitals reviewed.  Constitutional:      Appearance: Normal appearance.  HENT:     Right Ear: Tympanic membrane and external ear normal.     Left Ear: Tympanic membrane and external ear normal.  Cardiovascular:     Rate and Rhythm: Normal rate and regular rhythm.  Pulmonary:     Effort: Pulmonary effort is normal.     Breath sounds: Normal breath sounds.  Neurological:     Mental Status: She is alert.    BP 132/80 (BP  Location: Left Arm, Patient Position: Sitting, Cuff Size: Large)   Pulse (!) 107   Temp 98.2 F (36.8 C) (Oral)   Wt (!) 313 lb 4.8 oz (142.1 kg)   SpO2 99%   BMI 57.30 kg/m  Wt Readings from Last 3 Encounters:  09/07/20 (!) 313 lb 4.8 oz (142.1 kg)  08/21/20 (!) 315 lb (142.9 kg)  04/22/20 (!) 311 lb 8 oz (141.3 kg)     Health Maintenance Due  Topic Date Due   Pneumococcal Vaccine 80-58 Years old (2 - PCV) 09/05/2018   COVID-19 Vaccine (4 - Booster for Moderna series) 04/07/2020   INFLUENZA VACCINE  09/07/2020    There are no preventive care reminders to display for this patient.  Lab Results  Component Value Date   TSH 1.30 04/22/2020   Lab Results  Component Value Date   WBC 6.9 04/22/2020   HGB 11.5 (L) 04/22/2020   HCT 33.9 (L) 04/22/2020   MCV 79.1 04/22/2020   PLT 286.0 04/22/2020   Lab Results  Component Value Date   NA 137 04/22/2020   K 4.1 04/22/2020   CO2 28 04/22/2020   GLUCOSE 110 (H) 04/22/2020   BUN 7 04/22/2020   CREATININE 0.61 04/22/2020   BILITOT 0.6 04/22/2020   ALKPHOS 60 04/22/2020   AST 11 04/22/2020   ALT 14 04/22/2020   PROT 8.0 04/22/2020   ALBUMIN 4.0 04/22/2020   CALCIUM 9.6 04/22/2020   GFR 111.70 04/22/2020   Lab Results  Component Value Date   CHOL 123 04/22/2020   Lab Results  Component Value Date   HDL 39.60 04/22/2020   Lab Results  Component Value Date   LDLCALC 59 04/22/2020   Lab Results  Component Value Date   TRIG 123.0 04/22/2020   Lab Results  Component Value Date   CHOLHDL 3 04/22/2020   Lab Results  Component Value Date   HGBA1C 6.3 (A) 08/21/2020      Assessment & Plan:   Left otalgia.  Normal exam.  Question eustachian tube dysfunction.  She does describe some frequent postnasal drip symptoms.  -Add Flonase or Nasacort to her Zyrtec -Reassurance no signs of infection on exam in terms of any ear infection -Follow-up for any persistent or worsening symptoms  No orders of the defined types  were placed in this encounter.   Follow-up: No follow-ups on file.    Carolann Littler, MD

## 2020-11-13 ENCOUNTER — Other Ambulatory Visit: Payer: Self-pay | Admitting: Family Medicine

## 2020-11-13 LAB — HM DIABETES EYE EXAM

## 2020-11-23 ENCOUNTER — Ambulatory Visit (INDEPENDENT_AMBULATORY_CARE_PROVIDER_SITE_OTHER): Payer: BLUE CROSS/BLUE SHIELD | Admitting: Family Medicine

## 2020-11-23 ENCOUNTER — Other Ambulatory Visit: Payer: Self-pay

## 2020-11-23 VITALS — BP 120/80 | HR 81 | Temp 98.3°F | Ht 62.0 in | Wt 314.5 lb

## 2020-11-23 DIAGNOSIS — Z23 Encounter for immunization: Secondary | ICD-10-CM | POA: Diagnosis not present

## 2020-11-23 DIAGNOSIS — E119 Type 2 diabetes mellitus without complications: Secondary | ICD-10-CM | POA: Diagnosis not present

## 2020-11-23 LAB — POCT GLYCOSYLATED HEMOGLOBIN (HGB A1C): Hemoglobin A1C: 6.2 % — AB (ref 4.0–5.6)

## 2020-11-23 NOTE — Progress Notes (Signed)
Established Patient Office Visit  Subjective:  Patient ID: Misty Salazar, female    DOB: Aug 30, 1980  Age: 40 y.o. MRN: [id number]  CC:  Chief Complaint  Patient presents with   Follow-up    3 mo f/u for DM    HPI Misty Salazar presents for follow-up type 2 diabetes.  She remains on Rybelsus 7 mg daily.  Weight is essentially unchanged.  She has not been exercising much lately but plans to get back to the gym today.  No specific complaints today.  Blood sugars been stable.  Last A1c was 6.3%.  No polyuria or polydipsia.  She had recent eye exam which was normal  Past Medical History:  Diagnosis Date   Allergy    Anemia    Diabetes mellitus without complication ([place])    Hyperthyroidism    Thyroid disease    hyperthyroidism    Past Surgical History:  Procedure Laterality Date   CHOLECYSTECTOMY  01/20/2011   Procedure: LAPAROSCOPIC CHOLECYSTECTOMY;  Surgeon: Judieth Keens, DO;  Location: [facility];  Service: General;  Laterality: N/A;   KNEE SURGERY  08/2007, 10/2009   right knee- remove fibroma    Family History  Problem Relation Age of Onset   Hypertension Mother    Hyperlipidemia Mother    Cancer Mother        uterine cancer   Diabetes Mother    Diabetes Father    Hypertension Father    Hyperlipidemia Father    Cancer Maternal Grandmother        cervical   Stroke Maternal Grandmother    Stroke Maternal Grandfather    Stroke Paternal Grandfather    Hypertension Brother     Social History   Socioeconomic History   Marital status: Married    Spouse name: Not on file   Number of children: Not on file   Years of education: Not on file   Highest education level: Not on file  Occupational History   Not on file  Tobacco Use   Smoking status: Never   Smokeless tobacco: Never  Vaping Use   Vaping Use: Never used  Substance and Sexual Activity   Alcohol use: Yes    Alcohol/week: 0.0 standard drinks    Comment: occasional    Drug use: No    Sexual activity: Not on file  Other Topics Concern   Not on file  Social History Narrative   Not on file   Social Determinants of Health   Financial Resource Strain: Not on file  Food Insecurity: Not on file  Transportation Needs: Not on file  Physical Activity: Not on file  Stress: Not on file  Social Connections: Not on file  Intimate Partner Violence: Not on file    Outpatient Medications Prior to Visit  Medication Sig Dispense Refill   CAMILA 0.35 MG tablet      cetirizine (ZYRTEC) 10 MG tablet Take 10 mg by mouth daily as needed. Allergies      IRON PO Take 1 tablet by mouth daily.     Multiple Vitamin (MULTIVITAMIN) tablet Take 1 tablet by mouth daily.     RYBELSUS 7 MG TABS TAKE 1 TABLET BY MOUTH EVERY DAY 30 tablet 2   cyclobenzaprine (FLEXERIL) 10 MG tablet Take 1 tablet (10 mg total) by mouth at bedtime. (Patient not taking: Reported on 11/23/2020) 30 tablet 0   No facility-administered medications prior to visit.    Allergies  Allergen Reactions   Oxycodone-Acetaminophen Itching and  Hives   Percocet [Oxycodone-Acetaminophen] Hives and Itching    ROS Review of Systems  Constitutional:  Negative for chills and fever.  Endocrine: Negative for polydipsia and polyuria.     Objective:    Physical Exam Vitals reviewed.  Cardiovascular:     Rate and Rhythm: Normal rate and regular rhythm.  Pulmonary:     Effort: Pulmonary effort is normal.     Breath sounds: Normal breath sounds.  Neurological:     Mental Status: She is alert.    BP 120/80 (BP Location: Left Arm, Patient Position: Sitting, Cuff Size: Large)   Pulse 81   Temp 98.3 F (36.8 C) (Oral)   Ht 5\' 2"  (1.575 m)   Wt (!) 314 lb 8 oz (142.7 kg)   SpO2 97%   BMI 57.52 kg/m  Wt Readings from Last 3 Encounters:  11/23/20 (!) 314 lb 8 oz (142.7 kg)  09/07/20 (!) 313 lb 4.8 oz (142.1 kg)  08/21/20 (!) 315 lb (142.9 kg)     Health Maintenance Due  Topic Date Due   COVID-19 Vaccine (4 -  Booster for Moderna series) 04/01/2020    There are no preventive care reminders to display for this patient.  Lab Results  Component Value Date   TSH 1.30 04/22/2020   Lab Results  Component Value Date   WBC 6.9 04/22/2020   HGB 11.5 (L) 04/22/2020   HCT 33.9 (L) 04/22/2020   MCV 79.1 04/22/2020   PLT 286.0 04/22/2020   Lab Results  Component Value Date   NA 137 04/22/2020   K 4.1 04/22/2020   CO2 28 04/22/2020   GLUCOSE 110 (H) 04/22/2020   BUN 7 04/22/2020   CREATININE 0.61 04/22/2020   BILITOT 0.6 04/22/2020   ALKPHOS 60 04/22/2020   AST 11 04/22/2020   ALT 14 04/22/2020   PROT 8.0 04/22/2020   ALBUMIN 4.0 04/22/2020   CALCIUM 9.6 04/22/2020   GFR 111.70 04/22/2020   Lab Results  Component Value Date   CHOL 123 04/22/2020   Lab Results  Component Value Date   HDL 39.60 04/22/2020   Lab Results  Component Value Date   LDLCALC 59 04/22/2020   Lab Results  Component Value Date   TRIG 123.0 04/22/2020   Lab Results  Component Value Date   CHOLHDL 3 04/22/2020   Lab Results  Component Value Date   HGBA1C 6.2 (A) 11/23/2020      Assessment & Plan:   Problem List Items Addressed This Visit       Unprioritized   Type 2 diabetes mellitus without complications ([place]) - Primary   Relevant Orders   POCT HgB A1C (Completed)   Other Visit Diagnoses     Flu vaccine need       Relevant Orders   Flu Vaccine QUAD 6+ mos PF IM (Fluarix Quad PF) (Completed)     Type 2 diabetes well controlled with A1c today 6.2%.  Her lipids were checked in March and stable.  Blood pressure well controlled.  -We recommend she try to step up her physical activity -Flu vaccine given -Continue Rybelsus 7 mg daily -Set up 80-month follow-up  No orders of the defined types were placed in this encounter.   Follow-up: Return in about 3 months (around 02/23/2021).    Carolann Littler, MD

## 2020-11-23 NOTE — Patient Instructions (Signed)
A1C 6.2%.    Step up physical activity  Let's plan on 3 month follow up.

## 2020-11-25 ENCOUNTER — Encounter: Payer: Self-pay | Admitting: Family Medicine

## 2020-12-21 ENCOUNTER — Encounter: Payer: Self-pay | Admitting: Family Medicine

## 2020-12-21 DIAGNOSIS — Z113 Encounter for screening for infections with a predominantly sexual mode of transmission: Secondary | ICD-10-CM | POA: Diagnosis not present

## 2020-12-21 DIAGNOSIS — Z01411 Encounter for gynecological examination (general) (routine) with abnormal findings: Secondary | ICD-10-CM | POA: Diagnosis not present

## 2020-12-21 DIAGNOSIS — Z124 Encounter for screening for malignant neoplasm of cervix: Secondary | ICD-10-CM | POA: Diagnosis not present

## 2020-12-21 DIAGNOSIS — Z1231 Encounter for screening mammogram for malignant neoplasm of breast: Secondary | ICD-10-CM | POA: Diagnosis not present

## 2020-12-21 DIAGNOSIS — Z01419 Encounter for gynecological examination (general) (routine) without abnormal findings: Secondary | ICD-10-CM | POA: Diagnosis not present

## 2020-12-21 DIAGNOSIS — Z6841 Body Mass Index (BMI) 40.0 and over, adult: Secondary | ICD-10-CM | POA: Diagnosis not present

## 2020-12-24 ENCOUNTER — Other Ambulatory Visit: Payer: Self-pay | Admitting: Obstetrics and Gynecology

## 2020-12-24 DIAGNOSIS — R928 Other abnormal and inconclusive findings on diagnostic imaging of breast: Secondary | ICD-10-CM

## 2020-12-25 ENCOUNTER — Ambulatory Visit: Payer: BLUE CROSS/BLUE SHIELD | Admitting: Family Medicine

## 2020-12-28 ENCOUNTER — Ambulatory Visit (INDEPENDENT_AMBULATORY_CARE_PROVIDER_SITE_OTHER): Payer: BLUE CROSS/BLUE SHIELD | Admitting: Family Medicine

## 2020-12-28 VITALS — BP 160/80 | HR 91 | Temp 97.9°F | Wt 316.8 lb

## 2020-12-28 DIAGNOSIS — E119 Type 2 diabetes mellitus without complications: Secondary | ICD-10-CM | POA: Diagnosis not present

## 2020-12-28 MED ORDER — TRESIBA FLEXTOUCH 200 UNIT/ML ~~LOC~~ SOPN: 10.0000 [IU] | PEN_INJECTOR | Freq: Every day | SUBCUTANEOUS | 0 refills | Status: DC

## 2020-12-28 NOTE — Progress Notes (Signed)
Established Patient Office Visit  Subjective:  Patient ID: Misty Salazar, female    DOB: [birth year]  Age: 40 y.o. MRN: [id number]  CC:  Chief Complaint  Patient presents with   medication managment     HPI [name] presents for discussion regarding type 2 diabetes medications.  She has history of morbid obesity and type 2 diabetes.  She hopes to get pregnant soon.  She recently saw her GYN.   Patient states her GYN has requested that she try to get her weight down around 160 pounds before getting pregnant.  Patient states she has never been at that weight as an adult.  Her diabetes is currently treated with Rybelsus 7 mg daily.  There have been discussion of other medications in anticipation of pregnancy though her GYN has not recommended her getting pregnant at this time yet.  Patient did stop her OCPs about a month ago.  We explained the safest medication for treating diabetes in the setting of pregnancy is insulin.  We have also discussed the fact that it would be reasonable to get expertise of endocrinologist to help manage diabetes if she is looking at getting pregnant soon.  She has seen endocrinologist with [facility] in the past.  Thankfully, her diabetes has been well controlled.  Her most recent A1c was 6.2% October 17.  Past Medical History:  Diagnosis Date   Allergy    Anemia    Diabetes mellitus without complication ([place])    Hyperthyroidism    Thyroid disease    hyperthyroidism    Past Surgical History:  Procedure Laterality Date   CHOLECYSTECTOMY  01/20/2011   Procedure: LAPAROSCOPIC CHOLECYSTECTOMY;  Surgeon: Judieth Keens, DO;  Location: [facility];  Service: General;  Laterality: N/A;   KNEE SURGERY  08/2007, 10/2009   right knee- remove fibroma    Family History  Problem Relation Age of Onset   Hypertension Mother    Hyperlipidemia Mother    Cancer Mother        uterine cancer   Diabetes Mother    Diabetes Father    Hypertension  Father    Hyperlipidemia Father    Cancer Maternal Grandmother        cervical   Stroke Maternal Grandmother    Stroke Maternal Grandfather    Stroke Paternal Grandfather    Hypertension Brother     Social History   Socioeconomic History   Marital status: Married    Spouse name: Not on file   Number of children: Not on file   Years of education: Not on file   Highest education level: Bachelor's degree (e.g., BA, AB, BS)  Occupational History   Not on file  Tobacco Use   Smoking status: Never   Smokeless tobacco: Never  Vaping Use   Vaping Use: Never used  Substance and Sexual Activity   Alcohol use: Yes    Alcohol/week: 0.0 standard drinks    Comment: occasional    Drug use: No   Sexual activity: Not on file  Other Topics Concern   Not on file  Social History Narrative   Not on file   Social Determinants of Health   Financial Resource Strain: Low Risk    Difficulty of Paying Living Expenses: Not very hard  Food Insecurity: No Food Insecurity   Worried About Running Out of Food in the Last Year: Never true   Ran Out of Food in the Last Year: Never true  Transportation Needs: No Transportation  Needs   Lack of Transportation (Medical): No   Lack of Transportation (Non-Medical): No  Physical Activity: Sufficiently Active   Days of Exercise per Week: 3 days   Minutes of Exercise per Session: 50 min  Stress: No Stress Concern Present   Feeling of Stress : Not at all  Social Connections: Moderately Integrated   Frequency of Communication with Friends and Family: More than three times a week   Frequency of Social Gatherings with Friends and Family: Once a week   Attends Religious Services: More than 4 times per year   Active Member of [employer] or Organizations: No   Attends Music therapist: Not on file   Marital Status: Married  Human resources officer Violence: Not on file    Outpatient Medications Prior to Visit  Medication Sig Dispense Refill   CAMILA  0.35 MG tablet      cetirizine (ZYRTEC) 10 MG tablet Take 10 mg by mouth daily as needed. Allergies      IRON PO Take 1 tablet by mouth daily.     Multiple Vitamin (MULTIVITAMIN) tablet Take 1 tablet by mouth daily.     RYBELSUS 7 MG TABS TAKE 1 TABLET BY MOUTH EVERY DAY 30 tablet 2   cyclobenzaprine (FLEXERIL) 10 MG tablet Take 1 tablet (10 mg total) by mouth at bedtime. (Patient not taking: Reported on 11/23/2020) 30 tablet 0   No facility-administered medications prior to visit.    Allergies  Allergen Reactions   Oxycodone-Acetaminophen Itching and Hives   Percocet [Oxycodone-Acetaminophen] Hives and Itching    ROS Review of Systems  Constitutional:  Negative for appetite change and unexpected weight change.  Endocrine: Negative for polydipsia and polyuria.     Objective:    Physical Exam Vitals reviewed.  Cardiovascular:     Rate and Rhythm: Normal rate and regular rhythm.  Neurological:     Mental Status: She is alert.    BP (!) 160/80 (BP Location: Left Arm, Patient Position: Sitting, Cuff Size: Normal)   Pulse 91   Temp 97.9 F (36.6 C) (Oral)   Wt (!) 316 lb 12.8 oz (143.7 kg)   SpO2 98%   BMI 57.94 kg/m  Wt Readings from Last 3 Encounters:  12/28/20 (!) 316 lb 12.8 oz (143.7 kg)  11/23/20 (!) 314 lb 8 oz (142.7 kg)  09/07/20 (!) 313 lb 4.8 oz (142.1 kg)     Health Maintenance Due  Topic Date Due   Pneumococcal Vaccine 38-66 Years old (2 - PCV) 09/05/2018    There are no preventive care reminders to display for this patient.  Lab Results  Component Value Date   TSH 1.30 04/22/2020   Lab Results  Component Value Date   WBC 6.9 04/22/2020   HGB 11.5 (L) 04/22/2020   HCT 33.9 (L) 04/22/2020   MCV 79.1 04/22/2020   PLT 286.0 04/22/2020   Lab Results  Component Value Date   NA 137 04/22/2020   K 4.1 04/22/2020   CO2 28 04/22/2020   GLUCOSE 110 (H) 04/22/2020   BUN 7 04/22/2020   CREATININE 0.61 04/22/2020   BILITOT 0.6 04/22/2020    ALKPHOS 60 04/22/2020   AST 11 04/22/2020   ALT 14 04/22/2020   PROT 8.0 04/22/2020   ALBUMIN 4.0 04/22/2020   CALCIUM 9.6 04/22/2020   GFR 111.70 04/22/2020   Lab Results  Component Value Date   CHOL 123 04/22/2020   Lab Results  Component Value Date   HDL 39.60 04/22/2020  Lab Results  Component Value Date   LDLCALC 59 04/22/2020   Lab Results  Component Value Date   TRIG 123.0 04/22/2020   Lab Results  Component Value Date   CHOLHDL 3 04/22/2020   Lab Results  Component Value Date   HGBA1C 6.2 (A) 11/23/2020      Assessment & Plan:   Type 2 diabetes.  Patient contemplating pregnancy soon.  She is currently on Rybelsus.  -Recommend stop Rybelsus at this time as she has been off her OCPs for the past month -Start Tresiba 10 units once daily and monitor sugars daily.  If consistent fasting sugars over 130, titrate up 2 units every 3 days until they are consistently less than 130. -Setting up endocrine consult with endocrinologist she has seen in the past  Meds ordered this encounter  Medications   insulin degludec (TRESIBA FLEXTOUCH) 200 UNIT/ML FlexTouch Pen    Sig: Inject 10 Units into the skin daily.    Dispense:  3 mL    Refill:  0    Follow-up: No follow-ups on file.    Carolann Littler, MD

## 2020-12-28 NOTE — Patient Instructions (Signed)
STOP the Rybelsus  Start the insulin 10 units [place] once daily.    Check fasting glucose daily and if fasting > 130 consecutively for 3 days titrate insulin up 2 units every 3 days until consistently < 130.

## 2021-01-22 ENCOUNTER — Ambulatory Visit
Admission: RE | Admit: 2021-01-22 | Discharge: 2021-01-22 | Disposition: A | Discharge status: Home / Self Care | Payer: BC Managed Care – PPO | Source: Ambulatory Visit | Attending: Obstetrics and Gynecology | Admitting: Obstetrics and Gynecology

## 2021-01-22 ENCOUNTER — Ambulatory Visit
Admission: RE | Admit: 2021-01-22 | Discharge: 2021-01-22 | Disposition: A | Discharge status: Home / Self Care | Payer: BLUE CROSS/BLUE SHIELD | Source: Ambulatory Visit | Attending: Obstetrics and Gynecology | Admitting: Obstetrics and Gynecology

## 2021-01-22 ENCOUNTER — Other Ambulatory Visit: Payer: Self-pay | Admitting: Obstetrics and Gynecology

## 2021-01-22 ENCOUNTER — Other Ambulatory Visit: Payer: Self-pay

## 2021-01-22 DIAGNOSIS — R928 Other abnormal and inconclusive findings on diagnostic imaging of breast: Secondary | ICD-10-CM

## 2021-01-22 DIAGNOSIS — R922 Inconclusive mammogram: Secondary | ICD-10-CM | POA: Diagnosis not present

## 2021-01-22 DIAGNOSIS — N6311 Unspecified lump in the right breast, upper outer quadrant: Secondary | ICD-10-CM | POA: Diagnosis not present

## 2021-02-11 DIAGNOSIS — Z01419 Encounter for gynecological examination (general) (routine) without abnormal findings: Secondary | ICD-10-CM | POA: Diagnosis not present

## 2021-02-11 DIAGNOSIS — Z3002 Counseling and instruction in natural family planning to avoid pregnancy: Secondary | ICD-10-CM | POA: Diagnosis not present

## 2021-02-11 DIAGNOSIS — Z6841 Body Mass Index (BMI) 40.0 and over, adult: Secondary | ICD-10-CM | POA: Diagnosis not present

## 2021-02-11 DIAGNOSIS — N926 Irregular menstruation, unspecified: Secondary | ICD-10-CM | POA: Diagnosis not present

## 2021-02-16 ENCOUNTER — Other Ambulatory Visit: Payer: Self-pay | Admitting: Family Medicine

## 2021-02-16 MED ORDER — TRESIBA FLEXTOUCH 200 UNIT/ML ~~LOC~~ SOPN: 10.0000 [IU] | PEN_INJECTOR | Freq: Every day | SUBCUTANEOUS | 0 refills | Status: DC

## 2021-02-19 ENCOUNTER — Encounter: Payer: Self-pay | Admitting: Family Medicine

## 2021-02-19 DIAGNOSIS — N979 Female infertility, unspecified: Secondary | ICD-10-CM | POA: Diagnosis not present

## 2021-02-19 MED ORDER — TRESIBA FLEXTOUCH 200 UNIT/ML ~~LOC~~ SOPN: 10.0000 [IU] | PEN_INJECTOR | Freq: Every day | SUBCUTANEOUS | 0 refills | Status: DC

## 2021-02-23 ENCOUNTER — Ambulatory Visit (INDEPENDENT_AMBULATORY_CARE_PROVIDER_SITE_OTHER): Payer: BC Managed Care – PPO | Admitting: Family Medicine

## 2021-02-23 VITALS — BP 136/80 | HR 83 | Temp 98.0°F | Wt 316.5 lb

## 2021-02-23 DIAGNOSIS — E119 Type 2 diabetes mellitus without complications: Secondary | ICD-10-CM

## 2021-02-23 LAB — POCT GLYCOSYLATED HEMOGLOBIN (HGB A1C): Hemoglobin A1C: 6.5 % — AB (ref 4.0–5.6)

## 2021-02-23 MED ORDER — TRESIBA FLEXTOUCH 200 UNIT/ML ~~LOC~~ SOPN: PEN_INJECTOR | SUBCUTANEOUS | 5 refills | Status: DC

## 2021-02-23 NOTE — Progress Notes (Signed)
Established Patient Office Visit  Subjective:  Patient ID: Misty Salazar, female    DOB: [birth year]  Age: 41 y.o. MRN: [id number]  CC:  Chief Complaint  Patient presents with   Follow-up    HPI Misty Salazar presents for follow-up regarding type 2 diabetes and recent elevated blood pressure.  She is trying to get pregnant and is followed closely by her GYN and also plans to see endocrinologist next week.  We had transitioned her off Rybelsus because of her anticipation of pregnancy.  She is currently on Tresiba 14 units daily.  Blood sugars relatively stable.  No significant polyuria or polydipsia.  She had elevated blood pressure last visit but was under increased stress.  Blood pressure much improved today.  She states her blood pressure also has been controlled with other recent doctor visits.  He is taking prenatal multivitamin with folic acid  Past Medical History:  Diagnosis Date   Allergy    Anemia    Diabetes mellitus without complication ([place])    Hyperthyroidism    Thyroid disease    hyperthyroidism    Past Surgical History:  Procedure Laterality Date   CHOLECYSTECTOMY  01/20/2011   Procedure: LAPAROSCOPIC CHOLECYSTECTOMY;  Surgeon: Judieth Keens, DO;  Location: [facility];  Service: General;  Laterality: N/A;   KNEE SURGERY  08/2007, 10/2009   right knee- remove fibroma    Family History  Problem Relation Age of Onset   Hypertension Mother    Hyperlipidemia Mother    Cancer Mother        uterine cancer   Diabetes Mother    Diabetes Father    Hypertension Father    Hyperlipidemia Father    Cancer Maternal Grandmother        cervical   Stroke Maternal Grandmother    Stroke Maternal Grandfather    Stroke Paternal Grandfather    Hypertension Brother    Breast cancer Neg Hx     Social History   Socioeconomic History   Marital status: Married    Spouse name: Not on file   Number of children: Not on file   Years of education: Not on file    Highest education level: Bachelor's degree (e.g., BA, AB, BS)  Occupational History   Not on file  Tobacco Use   Smoking status: Never   Smokeless tobacco: Never  Vaping Use   Vaping Use: Never used  Substance and Sexual Activity   Alcohol use: Yes    Alcohol/week: 0.0 standard drinks    Comment: occasional    Drug use: No   Sexual activity: Not on file  Other Topics Concern   Not on file  Social History Narrative   Not on file   Social Determinants of Health   Financial Resource Strain: Low Risk    Difficulty of Paying Living Expenses: Not very hard  Food Insecurity: No Food Insecurity   Worried About Running Out of Food in the Last Year: Never true   Ran Out of Food in the Last Year: Never true  Transportation Needs: No Transportation Needs   Lack of Transportation (Medical): No   Lack of Transportation (Non-Medical): No  Physical Activity: Sufficiently Active   Days of Exercise per Week: 3 days   Minutes of Exercise per Session: 50 min  Stress: No Stress Concern Present   Feeling of Stress : Not at all  Social Connections: Moderately Integrated   Frequency of Communication with Friends and Family: More than three times  a week   Frequency of Social Gatherings with Friends and Family: Once a week   Attends Religious Services: More than 4 times per year   Active Member of [employer] or Organizations: No   Attends Music therapist: Not on file   Marital Status: Married  Human resources officer Violence: Not on file    Outpatient Medications Prior to Visit  Medication Sig Dispense Refill   cetirizine (ZYRTEC) 10 MG tablet Take 10 mg by mouth daily as needed. Allergies      IRON PO Take 1 tablet by mouth daily.     Multiple Vitamin (MULTIVITAMIN) tablet Take 1 tablet by mouth daily.     insulin degludec (TRESIBA FLEXTOUCH) 200 UNIT/ML FlexTouch Pen Inject 10 Units into the skin daily. 3 mL 0   CAMILA 0.35 MG tablet      No facility-administered medications prior  to visit.    Allergies  Allergen Reactions   Oxycodone-Acetaminophen Itching and Hives   Percocet [Oxycodone-Acetaminophen] Hives and Itching    ROS Review of Systems  Constitutional:  Negative for chills and fever.  Respiratory:  Negative for cough and shortness of breath.   Cardiovascular:  Negative for chest pain.     Objective:    Physical Exam Vitals reviewed.  Constitutional:      Appearance: Normal appearance.  Cardiovascular:     Rate and Rhythm: Normal rate and regular rhythm.     Heart sounds: No murmur heard. Pulmonary:     Effort: Pulmonary effort is normal.     Breath sounds: Normal breath sounds.  Neurological:     Mental Status: She is alert.    BP 136/80 (BP Location: Left Arm, Patient Position: Sitting, Cuff Size: Normal)    Pulse 83    Temp 98 F (36.7 C) (Oral)    Wt (!) 316 lb 8 oz (143.6 kg)    SpO2 99%    BMI 57.89 kg/m  Wt Readings from Last 3 Encounters:  02/23/21 (!) 316 lb 8 oz (143.6 kg)  12/28/20 (!) 316 lb 12.8 oz (143.7 kg)  11/23/20 (!) 314 lb 8 oz (142.7 kg)     Health Maintenance Due  Topic Date Due   Pneumococcal Vaccine 4-46 Years old (2 - PCV) 09/05/2018   URINE MICROALBUMIN  04/22/2021    There are no preventive care reminders to display for this patient.  Lab Results  Component Value Date   TSH 1.30 04/22/2020   Lab Results  Component Value Date   WBC 6.9 04/22/2020   HGB 11.5 (L) 04/22/2020   HCT 33.9 (L) 04/22/2020   MCV 79.1 04/22/2020   PLT 286.0 04/22/2020   Lab Results  Component Value Date   NA 137 04/22/2020   K 4.1 04/22/2020   CO2 28 04/22/2020   GLUCOSE 110 (H) 04/22/2020   BUN 7 04/22/2020   CREATININE 0.61 04/22/2020   BILITOT 0.6 04/22/2020   ALKPHOS 60 04/22/2020   AST 11 04/22/2020   ALT 14 04/22/2020   PROT 8.0 04/22/2020   ALBUMIN 4.0 04/22/2020   CALCIUM 9.6 04/22/2020   GFR 111.70 04/22/2020   Lab Results  Component Value Date   CHOL 123 04/22/2020   Lab Results  Component  Value Date   HDL 39.60 04/22/2020   Lab Results  Component Value Date   LDLCALC 59 04/22/2020   Lab Results  Component Value Date   TRIG 123.0 04/22/2020   Lab Results  Component Value Date   CHOLHDL 3 04/22/2020  Lab Results  Component Value Date   HGBA1C 6.5 (A) 02/23/2021      Assessment & Plan:   Problem List Items Addressed This Visit       Unprioritized   Type 2 diabetes mellitus without complications ([place]) - Primary   Relevant Medications   insulin degludec (TRESIBA FLEXTOUCH) 200 UNIT/ML FlexTouch Pen   Other Relevant Orders   POCT glycosylated hemoglobin (Hb A1C) (Completed)  A1c today 6.5%.  This compares to 6.2% recently.  Recently transition from Rybelsus to [place] in anticipation of pregnancy.  Her GYN has given approval for her also to be on metformin if necessary.  We did discuss possible adding back metformin but since she has follow-up with endocrinologist next week will defer to them.  We did refill her [place].  Discussed importance of regular exercise and getting as fit as possible in anticipation of pregnancy.  We will need to have continued close monitoring of blood pressure  Meds ordered this encounter  Medications   insulin degludec (TRESIBA FLEXTOUCH) 200 UNIT/ML FlexTouch Pen    Sig: Give 14 units [place] once daily    Dispense:  3 mL    Refill:  5    Follow-up: No follow-ups on file.    Carolann Littler, MD

## 2021-02-23 NOTE — Patient Instructions (Signed)
A1C today was 6.5%  Discuss with Endocrine whether to go back on Metformin

## 2021-03-03 DIAGNOSIS — N979 Female infertility, unspecified: Secondary | ICD-10-CM | POA: Diagnosis not present

## 2021-03-03 DIAGNOSIS — Z01419 Encounter for gynecological examination (general) (routine) without abnormal findings: Secondary | ICD-10-CM | POA: Diagnosis not present

## 2021-03-04 DIAGNOSIS — E119 Type 2 diabetes mellitus without complications: Secondary | ICD-10-CM | POA: Diagnosis not present

## 2021-03-04 DIAGNOSIS — Z794 Long term (current) use of insulin: Secondary | ICD-10-CM | POA: Diagnosis not present

## 2021-03-04 DIAGNOSIS — E0590 Thyrotoxicosis, unspecified without thyrotoxic crisis or storm: Secondary | ICD-10-CM | POA: Diagnosis not present

## 2021-04-01 DIAGNOSIS — N912 Amenorrhea, unspecified: Secondary | ICD-10-CM | POA: Diagnosis not present

## 2021-04-01 DIAGNOSIS — H8309 Labyrinthitis, unspecified ear: Secondary | ICD-10-CM | POA: Diagnosis not present

## 2021-04-01 DIAGNOSIS — R42 Dizziness and giddiness: Secondary | ICD-10-CM | POA: Diagnosis not present

## 2021-04-02 ENCOUNTER — Other Ambulatory Visit: Payer: Self-pay | Admitting: Obstetrics and Gynecology

## 2021-04-02 DIAGNOSIS — N979 Female infertility, unspecified: Secondary | ICD-10-CM

## 2021-04-22 DIAGNOSIS — N979 Female infertility, unspecified: Secondary | ICD-10-CM | POA: Diagnosis not present

## 2021-04-26 ENCOUNTER — Encounter: Payer: Self-pay | Admitting: Family Medicine

## 2021-04-26 ENCOUNTER — Ambulatory Visit (INDEPENDENT_AMBULATORY_CARE_PROVIDER_SITE_OTHER): Payer: BC Managed Care – PPO | Admitting: Family Medicine

## 2021-04-26 VITALS — BP 148/90 | HR 78 | Temp 98.3°F | Ht 62.0 in | Wt 307.9 lb

## 2021-04-26 DIAGNOSIS — Z0000 Encounter for general adult medical examination without abnormal findings: Secondary | ICD-10-CM | POA: Diagnosis not present

## 2021-04-26 DIAGNOSIS — R030 Elevated blood-pressure reading, without diagnosis of hypertension: Secondary | ICD-10-CM

## 2021-04-26 LAB — CBC WITH DIFFERENTIAL/PLATELET
Basophils Absolute: 0.1 10*3/uL (ref 0.0–0.1)
Basophils Relative: 1 % (ref 0.0–3.0)
Eosinophils Absolute: 0.1 10*3/uL (ref 0.0–0.7)
Eosinophils Relative: 2.1 % (ref 0.0–5.0)
HCT: 34.3 % — ABNORMAL LOW (ref 36.0–46.0)
Hemoglobin: 11 g/dL — ABNORMAL LOW (ref 12.0–15.0)
Lymphocytes Relative: 36 % (ref 12.0–46.0)
Lymphs Abs: 2.4 10*3/uL (ref 0.7–4.0)
MCHC: 32.1 g/dL (ref 30.0–36.0)
MCV: 78.7 fl (ref 78.0–100.0)
Monocytes Absolute: 0.4 10*3/uL (ref 0.1–1.0)
Monocytes Relative: 6.1 % (ref 3.0–12.0)
Neutro Abs: 3.6 10*3/uL (ref 1.4–7.7)
Neutrophils Relative %: 54.8 % (ref 43.0–77.0)
Platelets: 377 10*3/uL (ref 150.0–400.0)
RBC: 4.36 Mil/uL (ref 3.87–5.11)
RDW: 15.2 % (ref 11.5–15.5)
WBC: 6.6 10*3/uL (ref 4.0–10.5)

## 2021-04-26 LAB — LIPID PANEL
Cholesterol: 114 mg/dL (ref 0–200)
HDL: 45.9 mg/dL (ref >39.00)
LDL Cholesterol: 53 mg/dL (ref 0–99)
NonHDL: 68.19
Total CHOL/HDL Ratio: 2
Triglycerides: 75 mg/dL (ref 0.0–149.0)
VLDL: 15 mg/dL (ref 0.0–40.0)

## 2021-04-26 MED ORDER — AMLODIPINE BESYLATE 5 MG PO TABS: 5.0000 mg | ORAL_TABLET | Freq: Every day | ORAL | 5 refills | Status: DC

## 2021-04-26 NOTE — Patient Instructions (Signed)
Start the Amlodipine 5 mg daily ? ?Get BP monitored and be in touch if not consistently < 130/80 after a few weeks.   ?

## 2021-04-26 NOTE — Progress Notes (Signed)
Established Patient Office Visit  Subjective:  Patient ID: Misty Salazar, female    DOB: 18-Nov-1980  Age: 41 y.o. MRN: [id number]  CC:  Chief Complaint  Patient presents with   Annual Exam    HPI [name] presents for physical exam.  She has history of morbid obesity.  She has managed to lose from 316 pounds down to current weight of around 307 pounds.  She has type 2 diabetes.  She was placed on insulin and anticipation of desire to get pregnant.  Currently followed by endocrinology and on Tresiba 40 units daily.  Her blood sugars have been fairly well controlled.  Recent A1c 6.5%.  She does have elevated blood pressure today and had recent elevated reading of 173/77 endocrinology.  Rare alcohol.  No history of smoking.  No peripheral edema.  She has what appears to be history of chronic low-grade anemia.  No known history of thalassemia.  She had recent labs through endocrinology including electrolytes, renal function, thyroid functions.  Health maintenance reviewed:  -Flu vaccine up-to-date- -Prior hepatitis C screen negative -Pap smear up-to-date -Tetanus will be due later this year -Had mammogram in December.  Family history-her mother had uterine cancer and type 2 diabetes.  Both parents had hypertension hyperlipidemia.  Father also had type 2 diabetes.  Brother with hypertension.  Couple grandparents with stroke history.  Social history-married.  No children.  Rare alcohol.  Non-smoker.  Works from home.  [employer] research [employer]    Past Medical History:  Diagnosis Date   Allergy    Anemia    Diabetes mellitus without complication ([facility])    Hyperthyroidism    Thyroid disease    hyperthyroidism    Past Surgical History:  Procedure Laterality Date   CHOLECYSTECTOMY  01/20/2011   Procedure: LAPAROSCOPIC CHOLECYSTECTOMY;  Surgeon: Rulon Abide, DO;  Location: [facility];  Service: General;  Laterality: N/A;   KNEE SURGERY  08/2007, 10/2009    right knee- remove fibroma    Family History  Problem Relation Age of Onset   Hypertension Mother    Hyperlipidemia Mother    Cancer Mother        uterine cancer   Diabetes Mother    Diabetes Father    Hypertension Father    Hyperlipidemia Father    Cancer Maternal Grandmother        cervical   Stroke Maternal Grandmother    Stroke Maternal Grandfather    Stroke Paternal Grandfather    Hypertension Brother    Breast cancer Neg Hx     Social History   Socioeconomic History   Marital status: Married    Spouse name: Not on file   Number of children: Not on file   Years of education: Not on file   Highest education level: Bachelor's degree (e.g., BA, AB, BS)  Occupational History   Not on file  Tobacco Use   Smoking status: Never   Smokeless tobacco: Never  Vaping Use   Vaping Use: Never used  Substance and Sexual Activity   Alcohol use: Yes    Alcohol/week: 0.0 standard drinks    Comment: occasional    Drug use: No   Sexual activity: Not on file  Other Topics Concern   Not on file  Social History Narrative   Not on file   Social Determinants of Health   Financial Resource Strain: Low Risk    Difficulty of Paying Living Expenses: Not very hard  Food Insecurity: No Food  Insecurity   Worried About Programme researcher, broadcasting/film/video in the Last Year: Never true   Ran Out of Food in the Last Year: Never true  Transportation Needs: No Transportation Needs   Lack of Transportation (Medical): No   Lack of Transportation (Non-Medical): No  Physical Activity: Sufficiently Active   Days of Exercise per Week: 3 days   Minutes of Exercise per Session: 50 min  Stress: No Stress Concern Present   Feeling of Stress : Not at all  Social Connections: Moderately Integrated   Frequency of Communication with Friends and Family: More than three times a week   Frequency of Social Gatherings with Friends and Family: Once a week   Attends Religious Services: More than 4 times per year    Active Member of [employer] or Organizations: No   Attends Engineer, structural: Not on file   Marital Status: Married  Catering manager Violence: Not on file    Outpatient Medications Prior to Visit  Medication Sig Dispense Refill   cetirizine (ZYRTEC) 10 MG tablet Take 10 mg by mouth daily as needed. Allergies      insulin degludec (TRESIBA FLEXTOUCH) 200 UNIT/ML FlexTouch Pen Give 14 units [place] once daily 3 mL 5   IRON PO Take 1 tablet by mouth daily.     Multiple Vitamin (MULTIVITAMIN) tablet Take 1 tablet by mouth daily.     No facility-administered medications prior to visit.    Allergies  Allergen Reactions   Oxycodone-Acetaminophen Itching and Hives   Percocet [Oxycodone-Acetaminophen] Hives and Itching    ROS Review of Systems  Constitutional:  Negative for activity change, appetite change, fatigue, fever and unexpected weight change.  HENT:  Negative for ear pain, hearing loss, sore throat and trouble swallowing.   Eyes:  Negative for visual disturbance.  Respiratory:  Negative for cough and shortness of breath.   Cardiovascular:  Negative for chest pain and palpitations.  Gastrointestinal:  Negative for abdominal pain, blood in stool, constipation and diarrhea.  Endocrine: Negative for polydipsia and polyuria.  Genitourinary:  Negative for dysuria and hematuria.  Musculoskeletal:  Negative for arthralgias, back pain and myalgias.  Skin:  Negative for rash.  Neurological:  Negative for dizziness, syncope and headaches.  Hematological:  Negative for adenopathy.  Psychiatric/Behavioral:  Negative for confusion and dysphoric mood.      Objective:    Physical Exam Constitutional:      Appearance: She is well-developed.  HENT:     Head: Normocephalic and atraumatic.  Eyes:     Pupils: Pupils are equal, round, and reactive to light.  Neck:     Thyroid: No thyromegaly.  Cardiovascular:     Rate and Rhythm: Normal rate and regular rhythm.     Heart sounds:  Normal heart sounds. No murmur heard. Pulmonary:     Effort: No respiratory distress.     Breath sounds: Normal breath sounds. No wheezing or rales.  Abdominal:     General: Bowel sounds are normal. There is no distension.     Palpations: Abdomen is soft. There is no mass.     Tenderness: There is no abdominal tenderness. There is no guarding or rebound.  Musculoskeletal:        General: Normal range of motion.     Cervical back: Normal range of motion and neck supple.  Lymphadenopathy:     Cervical: No cervical adenopathy.  Skin:    Findings: No rash.     Comments: Feet reveal no lesions.  Normal sensory function with monofilament testing.  Good capillary refill.  Neurological:     Mental Status: She is alert and oriented to person, place, and time.     Cranial Nerves: No cranial nerve deficit.  Psychiatric:        Behavior: Behavior normal.        Thought Content: Thought content normal.        Judgment: Judgment normal.    BP (!) 142/80 (BP Location: Left Arm, Patient Position: Sitting, Cuff Size: Normal)   Pulse 78   Temp 98.3 F (36.8 C) (Oral)   Ht 5\' 2"  (1.575 m)   Wt (!) 307 lb 14.4 oz (139.7 kg)   SpO2 98%   BMI 56.32 kg/m  Wt Readings from Last 3 Encounters:  04/26/21 (!) 307 lb 14.4 oz (139.7 kg)  02/23/21 (!) 316 lb 8 oz (143.6 kg)  12/28/20 (!) 316 lb 12.8 oz (143.7 kg)     Health Maintenance Due  Topic Date Due   FOOT EXAM  04/22/2021   URINE MICROALBUMIN  04/22/2021    There are no preventive care reminders to display for this patient.  Lab Results  Component Value Date   TSH 1.30 04/22/2020   Lab Results  Component Value Date   WBC 6.9 04/22/2020   HGB 11.5 (L) 04/22/2020   HCT 33.9 (L) 04/22/2020   MCV 79.1 04/22/2020   PLT 286.0 04/22/2020   Lab Results  Component Value Date   NA 137 04/22/2020   K 4.1 04/22/2020   CO2 28 04/22/2020   GLUCOSE 110 (H) 04/22/2020   BUN 7 04/22/2020   CREATININE 0.61 04/22/2020   BILITOT 0.6  04/22/2020   ALKPHOS 60 04/22/2020   AST 11 04/22/2020   ALT 14 04/22/2020   PROT 8.0 04/22/2020   ALBUMIN 4.0 04/22/2020   CALCIUM 9.6 04/22/2020   GFR 111.70 04/22/2020   Lab Results  Component Value Date   CHOL 123 04/22/2020   Lab Results  Component Value Date   HDL 39.60 04/22/2020   Lab Results  Component Value Date   LDLCALC 59 04/22/2020   Lab Results  Component Value Date   TRIG 123.0 04/22/2020   Lab Results  Component Value Date   CHOLHDL 3 04/22/2020   Lab Results  Component Value Date   HGBA1C 6.5 (A) 02/23/2021      Assessment & Plan:   Problem List Items Addressed This Visit   None Visit Diagnoses     Physical exam    -  Primary   Relevant Orders   CBC with Differential/Platelet   Lipid panel   Microalbumin / creatinine urine ratio     -Check lab work including lipid panel, CBC, urine microalbumin.  Other physical labs were obtained recently through endocrinology.  Recent A1c 6.5%.  She does have elevated blood pressure today with repeat after rest left arm seated 148/90.  Multiple elevated readings recently including recent blood pressure 173/77 in endocrinology. -Keep sodium intake less than 2500 mg daily -Continue weight loss efforts -Start amlodipine 5 mg daily.  Would not start ACE inhibitor or ARB with her considering possible pregnancy.   Meds ordered this encounter  Medications   amLODipine (NORVASC) 5 MG tablet    Sig: Take 1 tablet (5 mg total) by mouth daily.    Dispense:  30 tablet    Refill:  5    Follow-up: No follow-ups on file.    Evelena Peat, MD

## 2021-04-27 ENCOUNTER — Telehealth: Payer: Self-pay | Admitting: Family Medicine

## 2021-04-27 LAB — MICROALBUMIN / CREATININE URINE RATIO
Creatinine,U: 94.9 mg/dL
Microalb Creat Ratio: 10.4 mg/g (ref 0.0–30.0)
Microalb, Ur: 9.9 mg/dL — ABNORMAL HIGH (ref 0.0–1.9)

## 2021-04-27 NOTE — Telephone Encounter (Signed)
Please see result note 

## 2021-04-27 NOTE — Telephone Encounter (Signed)
Pt is returning Mykal's call. ?

## 2021-05-17 ENCOUNTER — Ambulatory Visit
Admission: RE | Admit: 2021-05-17 | Discharge: 2021-05-17 | Disposition: A | Discharge status: Home / Self Care | Payer: BC Managed Care – PPO | Source: Ambulatory Visit | Attending: Obstetrics and Gynecology | Admitting: Obstetrics and Gynecology

## 2021-05-17 DIAGNOSIS — N979 Female infertility, unspecified: Secondary | ICD-10-CM

## 2021-05-27 DIAGNOSIS — D649 Anemia, unspecified: Secondary | ICD-10-CM | POA: Diagnosis not present

## 2021-05-27 DIAGNOSIS — Z01419 Encounter for gynecological examination (general) (routine) without abnormal findings: Secondary | ICD-10-CM | POA: Diagnosis not present

## 2021-05-27 DIAGNOSIS — D259 Leiomyoma of uterus, unspecified: Secondary | ICD-10-CM | POA: Diagnosis not present

## 2021-06-03 DIAGNOSIS — E0590 Thyrotoxicosis, unspecified without thyrotoxic crisis or storm: Secondary | ICD-10-CM | POA: Diagnosis not present

## 2021-06-03 DIAGNOSIS — E119 Type 2 diabetes mellitus without complications: Secondary | ICD-10-CM | POA: Diagnosis not present

## 2021-06-03 DIAGNOSIS — Z794 Long term (current) use of insulin: Secondary | ICD-10-CM | POA: Diagnosis not present

## 2021-07-29 ENCOUNTER — Other Ambulatory Visit: Payer: Self-pay | Admitting: Obstetrics and Gynecology

## 2021-07-30 ENCOUNTER — Other Ambulatory Visit: Payer: Self-pay | Admitting: Obstetrics and Gynecology

## 2021-07-30 ENCOUNTER — Ambulatory Visit
Admission: RE | Admit: 2021-07-30 | Discharge: 2021-07-30 | Disposition: A | Discharge status: Home / Self Care | Payer: BC Managed Care – PPO | Source: Ambulatory Visit | Attending: Obstetrics and Gynecology | Admitting: Obstetrics and Gynecology

## 2021-07-30 DIAGNOSIS — R928 Other abnormal and inconclusive findings on diagnostic imaging of breast: Secondary | ICD-10-CM

## 2021-07-30 DIAGNOSIS — N6311 Unspecified lump in the right breast, upper outer quadrant: Secondary | ICD-10-CM | POA: Diagnosis not present

## 2021-07-30 DIAGNOSIS — N6310 Unspecified lump in the right breast, unspecified quadrant: Secondary | ICD-10-CM

## 2021-08-30 DIAGNOSIS — H538 Other visual disturbances: Secondary | ICD-10-CM | POA: Diagnosis not present

## 2021-08-30 DIAGNOSIS — E119 Type 2 diabetes mellitus without complications: Secondary | ICD-10-CM | POA: Diagnosis not present

## 2021-09-19 ENCOUNTER — Other Ambulatory Visit: Payer: Self-pay

## 2021-09-19 ENCOUNTER — Encounter (HOSPITAL_COMMUNITY): Payer: Self-pay | Admitting: Obstetrics and Gynecology

## 2021-09-19 NOTE — Progress Notes (Signed)
PCP - Carolann Littler, MD  EKG - DOS  Fasting Blood Sugar - 108 Checks Blood Sugar 1/day  ERAS Protcol - NPO  Anesthesia review: N  Patient verbally denies any shortness of breath, fever, cough and chest pain during phone call   -------------  SDW INSTRUCTIONS given:  Your procedure is scheduled on 09/21/21.  Report to [facility] Main Entrance "A" at [place] and check in at the Admitting office.  Call this number if you have problems the morning of surgery:  [phone]   Remember:  Do not eat or drink after midnight the night before your surgery     Take these medicines the morning of surgery with A SIP OF WATER  amLODipine (NORVASC) cetirizine (ZYRTEC) triamcinolone (NASACORT ALLERGY 24HR)-if needed  As of today, STOP taking any Aspirin (unless otherwise instructed by your surgeon) Aleve, Naproxen, Ibuprofen, Motrin, Advil, Goody's, BC's, all herbal medications, fish oil, and all vitamins.                      Do not wear jewelry, make up, or nail polish            Do not wear lotions, powders, perfumes/colognes, or deodorant.            Do not shave 48 hours prior to surgery.  Men may shave face and neck.            Do not bring valuables to the hospital.            [facility] is not responsible for any belongings or valuables.  Do NOT Smoke (Tobacco/Vaping) 24 hours prior to your procedure If you use a CPAP at night, you may bring all equipment for your overnight stay.   Contacts, glasses, dentures or bridgework may not be worn into surgery.      For patients admitted to the hospital, discharge time will be determined by your treatment team.   Patients discharged the day of surgery will not be allowed to drive home, and someone needs to stay with them for 24 hours.    Special instructions:   [place]- Preparing For Surgery  Before surgery, you can play an important role. Because skin is not sterile, your skin needs to be as free of germs as possible.  You can reduce the number of germs on your skin by washing with CHG (chlorahexidine gluconate) Soap before surgery.  CHG is an antiseptic cleaner which kills germs and bonds with the skin to continue killing germs even after washing.    Oral Hygiene is also important to reduce your risk of infection.  Remember - BRUSH YOUR TEETH THE MORNING OF SURGERY WITH YOUR REGULAR TOOTHPASTE  Please do not use if you have an allergy to CHG or antibacterial soaps. If your skin becomes reddened/irritated stop using the CHG.  Do not shave (including legs and underarms) for at least 48 hours prior to first CHG shower. It is OK to shave your face.  Please follow these instructions carefully.   Shower the NIGHT BEFORE SURGERY and the MORNING OF SURGERY with DIAL Soap.   Pat yourself dry with a CLEAN TOWEL.  Wear CLEAN PAJAMAS to bed the night before surgery  Place CLEAN SHEETS on your bed the night of your first shower and DO NOT SLEEP WITH PETS.   Day of Surgery: Please shower morning of surgery  Wear Clean/Comfortable clothing the morning of surgery Do not apply any deodorants/lotions.   Remember  to brush your teeth WITH YOUR REGULAR TOOTHPASTE.   Questions were answered. Patient verbalized understanding of instructions.

## 2021-09-20 DIAGNOSIS — H43392 Other vitreous opacities, left eye: Secondary | ICD-10-CM | POA: Diagnosis not present

## 2021-09-20 DIAGNOSIS — H538 Other visual disturbances: Secondary | ICD-10-CM | POA: Diagnosis not present

## 2021-09-20 DIAGNOSIS — E119 Type 2 diabetes mellitus without complications: Secondary | ICD-10-CM | POA: Diagnosis not present

## 2021-09-20 NOTE — Anesthesia Preprocedure Evaluation (Signed)
Anesthesia Evaluation  Patient identified by MRN, date of birth, ID band Patient awake    Reviewed: Allergy & Precautions, NPO status , Patient's Chart, lab work & pertinent test results  History of Anesthesia Complications Negative for: history of anesthetic complications  Airway Mallampati: III  TM Distance: >3 FB Neck ROM: Full    Dental no notable dental hx. (+) Dental Advisory Given   Pulmonary neg pulmonary ROS,    Pulmonary exam normal        Cardiovascular hypertension, Pt. on medications Normal cardiovascular exam     Neuro/Psych negative neurological ROS     GI/Hepatic negative GI ROS, Neg liver ROS,   Endo/Other  diabetesHyperthyroidism Morbid obesity  Renal/GU negative Renal ROS     Musculoskeletal negative musculoskeletal ROS (+)   Abdominal   Peds  Hematology negative hematology ROS (+)   Anesthesia Other Findings   Reproductive/Obstetrics                            Anesthesia Physical Anesthesia Plan  ASA: 3  Anesthesia Plan:    Post-op Pain Management: Celebrex PO (pre-op)* and Tylenol PO (pre-op)*   Induction: Intravenous  PONV Risk Score and Plan: 3 and Ondansetron, Midazolam and Dexamethasone  Airway Management Planned: LMA  Additional Equipment:   Intra-op Plan:   Post-operative Plan: Extubation in OR  Informed Consent: I have reviewed the patients History and Physical, chart, labs and discussed the procedure including the risks, benefits and alternatives for the proposed anesthesia with the patient or authorized representative who has indicated his/her understanding and acceptance.     Dental advisory given  Plan Discussed with: Anesthesiologist and CRNA  Anesthesia Plan Comments:        Anesthesia Quick Evaluation

## 2021-09-21 ENCOUNTER — Ambulatory Visit (HOSPITAL_COMMUNITY): Payer: BC Managed Care – PPO | Admitting: Anesthesiology

## 2021-09-21 ENCOUNTER — Encounter (HOSPITAL_COMMUNITY): Payer: Self-pay | Admitting: Obstetrics and Gynecology

## 2021-09-21 ENCOUNTER — Encounter (HOSPITAL_COMMUNITY)
Admission: RE | Disposition: A | Discharge status: Home / Self Care | Payer: Self-pay | Source: Home / Self Care | Attending: Obstetrics and Gynecology

## 2021-09-21 ENCOUNTER — Ambulatory Visit (HOSPITAL_COMMUNITY)
Admission: RE | Admit: 2021-09-21 | Discharge: 2021-09-21 | Disposition: A | Discharge status: Home / Self Care | Payer: BC Managed Care – PPO | Attending: Obstetrics and Gynecology | Admitting: Obstetrics and Gynecology

## 2021-09-21 ENCOUNTER — Other Ambulatory Visit: Payer: Self-pay

## 2021-09-21 DIAGNOSIS — I10 Essential (primary) hypertension: Secondary | ICD-10-CM | POA: Diagnosis not present

## 2021-09-21 DIAGNOSIS — Z6841 Body Mass Index (BMI) 40.0 and over, adult: Secondary | ICD-10-CM | POA: Insufficient documentation

## 2021-09-21 DIAGNOSIS — N840 Polyp of corpus uteri: Secondary | ICD-10-CM | POA: Insufficient documentation

## 2021-09-21 DIAGNOSIS — E119 Type 2 diabetes mellitus without complications: Secondary | ICD-10-CM | POA: Diagnosis not present

## 2021-09-21 DIAGNOSIS — D250 Submucous leiomyoma of uterus: Secondary | ICD-10-CM | POA: Diagnosis not present

## 2021-09-21 DIAGNOSIS — D259 Leiomyoma of uterus, unspecified: Secondary | ICD-10-CM | POA: Diagnosis not present

## 2021-09-21 HISTORY — PX: DILATATION & CURETTAGE/HYSTEROSCOPY WITH MYOSURE: SHX6511

## 2021-09-21 HISTORY — DX: Essential (primary) hypertension: I10

## 2021-09-21 LAB — BASIC METABOLIC PANEL
Anion gap: 8 (ref 5–15)
BUN: 7 mg/dL (ref 6–20)
CO2: 24 mmol/L (ref 22–32)
Calcium: 8.8 mg/dL — ABNORMAL LOW (ref 8.9–10.3)
Chloride: 106 mmol/L (ref 98–111)
Creatinine, Ser: 0.69 mg/dL (ref 0.44–1.00)
GFR, Estimated: >60 mL/min (ref >60)
Glucose, Bld: 110 mg/dL — ABNORMAL HIGH (ref 70–99)
Potassium: 3.3 mmol/L — ABNORMAL LOW (ref 3.5–5.1)
Sodium: 138 mmol/L (ref 135–145)

## 2021-09-21 LAB — CBC
HCT: 29.5 % — ABNORMAL LOW (ref 36.0–46.0)
Hemoglobin: 9.4 g/dL — ABNORMAL LOW (ref 12.0–15.0)
MCH: 25.6 pg — ABNORMAL LOW (ref 26.0–34.0)
MCHC: 31.9 g/dL (ref 30.0–36.0)
MCV: 80.4 fL (ref 80.0–100.0)
Platelets: 332 10*3/uL (ref 150–400)
RBC: 3.67 MIL/uL — ABNORMAL LOW (ref 3.87–5.11)
RDW: 14.9 % (ref 11.5–15.5)
WBC: 5.9 10*3/uL (ref 4.0–10.5)
nRBC: 0 % (ref 0.0–0.2)

## 2021-09-21 LAB — GLUCOSE, CAPILLARY
Glucose-Capillary: 110 mg/dL — ABNORMAL HIGH (ref 70–99)
Glucose-Capillary: 112 mg/dL — ABNORMAL HIGH (ref 70–99)
Glucose-Capillary: 90 mg/dL (ref 70–99)

## 2021-09-21 LAB — POCT PREGNANCY, URINE: Preg Test, Ur: NEGATIVE

## 2021-09-21 SURGERY — DILATATION & CURETTAGE/HYSTEROSCOPY WITH MYOSURE
Anesthesia: General | Site: Vagina

## 2021-09-21 MED ORDER — KETOROLAC TROMETHAMINE 30 MG/ML IJ SOLN
INTRAMUSCULAR | Status: AC
  Filled 2021-09-21: qty 1

## 2021-09-21 MED ORDER — PROPOFOL 10 MG/ML IV BOLUS
INTRAVENOUS | Status: AC
  Filled 2021-09-21: qty 20

## 2021-09-21 MED ORDER — IBUPROFEN 600 MG PO TABS: 600.0000 mg | ORAL_TABLET | Freq: Four times a day (QID) | ORAL | 0 refills | Status: DC | PRN

## 2021-09-21 MED ORDER — LIDOCAINE HCL (CARDIAC) PF 100 MG/5ML IV SOSY
PREFILLED_SYRINGE | INTRAVENOUS | Status: DC | PRN
  Administered 2021-09-21: 100 mg via INTRAVENOUS

## 2021-09-21 MED ORDER — LIDOCAINE HCL (PF) 2 % IJ SOLN
INTRAMUSCULAR | Status: DC | PRN
  Administered 2021-09-21: 10 mL via INTRADERMAL

## 2021-09-21 MED ORDER — LIDOCAINE HCL 2 % IJ SOLN
INTRAMUSCULAR | Status: AC
  Filled 2021-09-21: qty 20

## 2021-09-21 MED ORDER — LIDOCAINE 2% (20 MG/ML) 5 ML SYRINGE
INTRAMUSCULAR | Status: AC
  Filled 2021-09-21: qty 5

## 2021-09-21 MED ORDER — ORAL CARE MOUTH RINSE
15.0000 mL | Freq: Once | OROMUCOSAL | Status: AC
Start: 2021-09-21 — End: 2021-09-21

## 2021-09-21 MED ORDER — PROMETHAZINE HCL 25 MG/ML IJ SOLN: 6.2500 mg | INTRAMUSCULAR | Status: DC | PRN

## 2021-09-21 MED ORDER — ONDANSETRON HCL 4 MG/2ML IJ SOLN
INTRAMUSCULAR | Status: DC | PRN
  Administered 2021-09-21: 4 mg via INTRAVENOUS

## 2021-09-21 MED ORDER — ACETAMINOPHEN 500 MG PO TABS
1000.0000 mg | ORAL_TABLET | Freq: Once | ORAL | Status: AC
  Administered 2021-09-21: 1000 mg via ORAL
  Filled 2021-09-21: qty 2

## 2021-09-21 MED ORDER — PROPOFOL 10 MG/ML IV BOLUS
INTRAVENOUS | Status: DC | PRN
  Administered 2021-09-21: 150 mg via INTRAVENOUS

## 2021-09-21 MED ORDER — LACTATED RINGERS IV SOLN: INTRAVENOUS | Status: DC

## 2021-09-21 MED ORDER — DEXAMETHASONE SODIUM PHOSPHATE 10 MG/ML IJ SOLN
INTRAMUSCULAR | Status: AC
  Filled 2021-09-21: qty 1

## 2021-09-21 MED ORDER — AMISULPRIDE (ANTIEMETIC) 5 MG/2ML IV SOLN: 10.0000 mg | Freq: Once | INTRAVENOUS | Status: DC | PRN

## 2021-09-21 MED ORDER — SCOPOLAMINE 1 MG/3DAYS TD PT72
1.0000 | MEDICATED_PATCH | TRANSDERMAL | Status: DC
  Administered 2021-09-21: 1.5 mg via TRANSDERMAL
  Filled 2021-09-21: qty 1

## 2021-09-21 MED ORDER — ONDANSETRON HCL 4 MG/2ML IJ SOLN
INTRAMUSCULAR | Status: AC
  Filled 2021-09-21: qty 2

## 2021-09-21 MED ORDER — FENTANYL CITRATE (PF) 100 MCG/2ML IJ SOLN
INTRAMUSCULAR | Status: DC | PRN
  Administered 2021-09-21 (×2): 25 ug via INTRAVENOUS
  Administered 2021-09-21: 100 ug via INTRAVENOUS

## 2021-09-21 MED ORDER — MIDAZOLAM HCL 2 MG/2ML IJ SOLN
INTRAMUSCULAR | Status: AC
  Filled 2021-09-21: qty 2

## 2021-09-21 MED ORDER — MIDAZOLAM HCL 5 MG/5ML IJ SOLN
INTRAMUSCULAR | Status: DC | PRN
  Administered 2021-09-21: 2 mg via INTRAVENOUS

## 2021-09-21 MED ORDER — CELECOXIB 200 MG PO CAPS
200.0000 mg | ORAL_CAPSULE | Freq: Once | ORAL | Status: AC
  Administered 2021-09-21: 200 mg via ORAL
  Filled 2021-09-21: qty 1

## 2021-09-21 MED ORDER — POVIDONE-IODINE 10 % EX SWAB
2.0000 | Freq: Once | CUTANEOUS | Status: AC
  Administered 2021-09-21: 2 via TOPICAL

## 2021-09-21 MED ORDER — FENTANYL CITRATE (PF) 250 MCG/5ML IJ SOLN
INTRAMUSCULAR | Status: AC
  Filled 2021-09-21: qty 5

## 2021-09-21 MED ORDER — DEXAMETHASONE SODIUM PHOSPHATE 10 MG/ML IJ SOLN
INTRAMUSCULAR | Status: DC | PRN
  Administered 2021-09-21: 5 mg via INTRAVENOUS

## 2021-09-21 MED ORDER — SODIUM CHLORIDE 0.9 % IR SOLN
Status: DC | PRN
  Administered 2021-09-21 (×2): 3000 mL

## 2021-09-21 MED ORDER — CHLORHEXIDINE GLUCONATE 0.12 % MT SOLN
15.0000 mL | Freq: Once | OROMUCOSAL | Status: AC
  Administered 2021-09-21: 15 mL via OROMUCOSAL
  Filled 2021-09-21: qty 15

## 2021-09-21 MED ORDER — KETOROLAC TROMETHAMINE 30 MG/ML IJ SOLN
INTRAMUSCULAR | Status: DC | PRN
  Administered 2021-09-21: 30 mg via INTRAVENOUS

## 2021-09-21 MED ORDER — FENTANYL CITRATE (PF) 100 MCG/2ML IJ SOLN: 25.0000 ug | INTRAMUSCULAR | Status: DC | PRN

## 2021-09-21 SURGICAL SUPPLY — 14 items
CATH ROBINSON RED A/P 16FR (CATHETERS) ×2 IMPLANT
DEVICE MYOSURE LITE (MISCELLANEOUS) ×1 IMPLANT
DEVICE MYOSURE REACH (MISCELLANEOUS) IMPLANT
DILATOR CANAL MILEX (MISCELLANEOUS) IMPLANT
GLOVE BIO SURGEON STRL SZ 6.5 (GLOVE) ×4 IMPLANT
GLOVE SURG UNDER POLY LF SZ7 (GLOVE) ×6 IMPLANT
GOWN STRL REUS W/ TWL LRG LVL3 (GOWN DISPOSABLE) ×2 IMPLANT
GOWN STRL REUS W/TWL LRG LVL3 (GOWN DISPOSABLE) ×4
KIT PROCEDURE FLUENT (KITS) ×2 IMPLANT
PACK VAGINAL MINOR WOMEN LF (CUSTOM PROCEDURE TRAY) ×2 IMPLANT
PAD OB MATERNITY 4.3X12.25 (PERSONAL CARE ITEMS) ×2 IMPLANT
SEAL ROD LENS SCOPE MYOSURE (ABLATOR) ×2 IMPLANT
TOWEL GREEN STERILE FF (TOWEL DISPOSABLE) ×4 IMPLANT
UNDERPAD 30X36 HEAVY ABSORB (UNDERPADS AND DIAPERS) ×2 IMPLANT

## 2021-09-21 NOTE — Addendum Note (Signed)
Addendum  created 09/21/21 1346 by Michele Rockers, CRNA   Charge Capture section accepted

## 2021-09-21 NOTE — Anesthesia Procedure Notes (Signed)
Procedure Name: LMA Insertion Date/Time: 09/21/2021 10:30 AM  Performed by: Michele Rockers, CRNAPre-anesthesia Checklist: Patient identified, Patient being monitored, Timeout performed, Emergency Drugs available and Suction available Patient Re-evaluated:Patient Re-evaluated prior to induction Oxygen Delivery Method: Circle System Utilized Preoxygenation: Pre-oxygenation with 100% oxygen Induction Type: IV induction LMA: LMA inserted LMA Size: 4.0 Number of attempts: 1 Placement Confirmation: positive ETCO2 and breath sounds checked- equal and bilateral Tube secured with: Tape Dental Injury: Teeth and Oropharynx as per pre-operative assessment

## 2021-09-21 NOTE — Transfer of Care (Signed)
Immediate Anesthesia Transfer of Care Note  Patient: Misty Salazar  Procedure(s) Performed: DILATATION & CURETTAGE/HYSTEROSCOPY WITH MYOSURE (Vagina )  Patient Location: PACU  Anesthesia Type:General  Level of Consciousness: drowsy, patient cooperative and responds to stimulation  Airway & Oxygen Therapy: Patient Spontanous Breathing and Patient connected to face mask oxygen  Post-op Assessment: Report given to RN and Post -op Vital signs reviewed and stable  Post vital signs: Reviewed and stable  Last Vitals:  Vitals Value Taken Time  BP 146/79 09/21/21 1115  Temp    Pulse 87 09/21/21 1117  Resp 19 09/21/21 1117  SpO2 100 % 09/21/21 1117  Vitals shown include unvalidated device data.  Last Pain:  Vitals:   09/21/21 0735  TempSrc:   PainSc: 0-No pain         Complications: No notable events documented.

## 2021-09-21 NOTE — Op Note (Signed)
Pre op DX: LEIOMYOMA OF UTERUS   Post Op GU:[id number] with large endometrial polyp and blood clots   PHYSICIAN : Delynn Pursley   ASSISTANTS: none   ANESTHESIA:   general and paracervical block  ESTIMATED BLOOD LOSS: minimal  LOCAL MEDICATIONS USED:  LIDOCAINE 10CC  2%  SPECIMEN:  Source of Specimen:  endometrial curettings and polyp  DISPOSITION OF SPECIMEN:  PATHOLOGY  COUNTS Correct:  YES  Fluid deficit 450 cc saline    DICTATION #: The patient was taken to the operating room and prepped and draped in a normal sterile fashion. An in out catheter was used to drain the bladder.   A bivalve speculum was placed into the vagina and anterior lip of the cervix was grasped with a single-tooth tenaculum.  20 cc of 2% lidocaine was used for cervical block.  the cervix was then dilated with Kennon Rounds dilators up to 21. The hysteroscope was placed into the uterine cavity. The  entire uterus and both ostia were visualized. A large 2 cm polyp was seen at the fundus and blood clots  were also seen. The Myosure light was used to remove the polyp.     Hyseroscope was then removed from the uterus. A sharp curettage was then done with a curette and endometrial curettings were obtained. The endometrial curettings were sent to pathology. Again the hysteroscope was placed into the uterine cavity. Both ostia were again visualized.    The tenaculum was removed from the cervix and hemostasis was noted.   PLAN OF CARE: discharge to home  PATIENT DISPOSITION:  PACU - hemodynamically stable.

## 2021-09-21 NOTE — H&P (Signed)
Misty Salazar y.o. female. Who presents with heavy and irreg vaginal bleeding for years.  She had an HSG which demonstraed a filling defect c/w polyp or fobroid.  This was confirmed with ultrasound.  .  .  She has uses 1 pads/ tampons 2-3 hour while menstruating.  She denies any CP or SOB.  Motrin makes it better.  nothingmakes it worse.  Occasional dysmenorrhea.   Pertinent Gynecological History: Contraception: Education given regarding options for contraception, including  na . Blood transfusions: none Sexually transmitted diseases: NA Previous GYN Procedures: none Last mammogram: WNL Last pap: normal Date: 2023 OB History: G)   Menstrual History: Menarche age: 33 No LMP recorded.    Past Medical History:  Diagnosis Date   Allergy    Anemia    COVID-19 2022   Diabetes mellitus without complication ([place])    Hypertension    Hyperthyroidism    Thyroid disease    hyperthyroidism   Past Surgical History:  Procedure Laterality Date   CHOLECYSTECTOMY  01/20/2011   Procedure: LAPAROSCOPIC CHOLECYSTECTOMY;  Surgeon: Judieth Keens, DO;  Location: [facility];  Service: General;  Laterality: N/A;   KNEE SURGERY  08/2007, 10/2009   right knee- remove fibroma    Current Facility-Administered Medications:    lactated ringers infusion, , Intravenous, Continuous, Duane Boston, MD   scopolamine (TRANSDERM-SCOP) 1 MG/3DAYS 1.5 mg, 1 patch, Transdermal, Q72H, Duane Boston, MD, 1.5 mg at 09/21/21 0740 Allergies  Allergen Reactions   Oxycodone-Acetaminophen Itching and Hives   Percocet [Oxycodone-Acetaminophen] Hives and Itching   Review of Systems -  see above   Physical Exam  BP (!) 172/84   Pulse 91   Temp 98.3 F (36.8 C) (Oral)   Resp 18   Ht '5\' 2"'$  (1.575 m)   Wt (!) 138.3 kg   SpO2 100%   BMI 55.79 kg/m  Constitutional: She appears well-developed and well-nourished.  HENT:  Head: Normocephalic.  Eyes: Pupils are equal, round, and reactive to light.  Neck:  Normal range of motion. Neck supple.  Cardiovascular: Regular rhythm.   Respiratory: Effort normal and breath sounds normal.  GI: Soft.  Genitourinary:v/v WNL.  Uterus difficult to tell size because of body habitus.  No adnexal masses palpated.   Musculoskeletal: Normal range of motion.  Neurological: She is alert.  Skin: Skin is warm.  Psychiatric: She has a normal mood and affect.  Results for orders placed or performed during the hospital encounter of 09/21/21 (from the past 72 hour(s))  CBC     Status: Abnormal   Collection Time: 09/21/21  7:01 AM  Result Value Ref Range   WBC 5.9 4.0 - 10.5 K/uL   RBC 3.67 (L) 3.87 - 5.11 MIL/uL   Hemoglobin 9.4 (L) 12.0 - 15.0 g/dL   HCT 29.5 (L) 36.0 - 46.0 %   MCV 80.4 80.0 - 100.0 fL   MCH 25.6 (L) 26.0 - 34.0 pg   MCHC 31.9 30.0 - 36.0 g/dL   RDW 14.9 11.5 - 15.5 %   Platelets 332 150 - 400 K/uL   nRBC 0.0 0.0 - 0.2 %    Comment: Performed at [facility], [place] [address], [place], [place] [address]  Glucose, capillary     Status: Abnormal   Collection Time: 09/21/21  7:19 AM  Result Value Ref Range   Glucose-Capillary 110 (H) 70 - 99 mg/dL    Comment: Glucose reference range applies only to samples taken after fasting for at least 8  hours.  Pregnancy, urine POC     Status: None   Collection Time: 09/21/21  7:54 AM  Result Value Ref Range   Preg Test, Ur NEGATIVE NEGATIVE    Comment:        THE SENSITIVITY OF THIS METHODOLOGY IS >24 mIU/mL    [place] width4  Length8.2 Ovariesappeared WNL  Assessment/Plan: Submucosal fibroid Infertility Will do removal of fibroid Pt offered  obs vs surgery.  Pt chose surgery.  Plan D&C hysteroscopy polypectomy.  Risks are but not limited to bleeding, infection, scarring of the uterus and perforation.  She also may need a repeat surgery to remove the entire fibroid   [employer] A 12/27/2010, 11:41 AM

## 2021-09-21 NOTE — Anesthesia Postprocedure Evaluation (Signed)
Anesthesia Post Note  Patient: Misty Salazar  Procedure(s) Performed: DILATATION & CURETTAGE/HYSTEROSCOPY WITH MYOSURE (Vagina )     Patient location during evaluation: PACU Anesthesia Type: General Level of consciousness: sedated Pain management: pain level controlled Vital Signs Assessment: post-procedure vital signs reviewed and stable Respiratory status: spontaneous breathing and respiratory function stable Cardiovascular status: stable Postop Assessment: no apparent nausea or vomiting Anesthetic complications: no   No notable events documented.  Last Vitals:  Vitals:   09/21/21 1130 09/21/21 1145  BP: (!) 147/86 139/86  Pulse: 84 76  Resp: 18 17  Temp:  36.8 C  SpO2: 93% 94%    Last Pain:  Vitals:   09/21/21 1145  TempSrc:   PainSc: 0-No pain                 Chandrika Sandles DANIEL

## 2021-09-22 ENCOUNTER — Encounter (HOSPITAL_COMMUNITY): Payer: Self-pay | Admitting: Obstetrics and Gynecology

## 2021-09-22 LAB — SURGICAL PATHOLOGY

## 2021-10-04 DIAGNOSIS — Z794 Long term (current) use of insulin: Secondary | ICD-10-CM | POA: Diagnosis not present

## 2021-10-04 DIAGNOSIS — E119 Type 2 diabetes mellitus without complications: Secondary | ICD-10-CM | POA: Diagnosis not present

## 2021-10-04 DIAGNOSIS — E0590 Thyrotoxicosis, unspecified without thyrotoxic crisis or storm: Secondary | ICD-10-CM | POA: Diagnosis not present

## 2021-10-05 DIAGNOSIS — Z09 Encounter for follow-up examination after completed treatment for conditions other than malignant neoplasm: Secondary | ICD-10-CM | POA: Diagnosis not present

## 2021-10-05 DIAGNOSIS — Z01419 Encounter for gynecological examination (general) (routine) without abnormal findings: Secondary | ICD-10-CM | POA: Diagnosis not present

## 2021-10-21 ENCOUNTER — Other Ambulatory Visit: Payer: Self-pay | Admitting: Family Medicine

## 2021-12-07 ENCOUNTER — Other Ambulatory Visit: Payer: Self-pay | Admitting: Obstetrics and Gynecology

## 2021-12-07 DIAGNOSIS — N979 Female infertility, unspecified: Secondary | ICD-10-CM

## 2021-12-13 ENCOUNTER — Ambulatory Visit
Admission: RE | Admit: 2021-12-13 | Discharge: 2021-12-13 | Disposition: A | Discharge status: Home / Self Care | Payer: BC Managed Care – PPO | Source: Ambulatory Visit | Attending: Obstetrics and Gynecology | Admitting: Obstetrics and Gynecology

## 2021-12-13 DIAGNOSIS — Z3141 Encounter for fertility testing: Secondary | ICD-10-CM | POA: Diagnosis not present

## 2021-12-13 DIAGNOSIS — N979 Female infertility, unspecified: Secondary | ICD-10-CM

## 2022-01-28 ENCOUNTER — Other Ambulatory Visit: Payer: Self-pay | Admitting: Family Medicine

## 2022-02-02 ENCOUNTER — Ambulatory Visit
Admission: RE | Admit: 2022-02-02 | Discharge: 2022-02-02 | Disposition: A | Discharge status: Home / Self Care | Payer: BC Managed Care – PPO | Source: Ambulatory Visit | Attending: Obstetrics and Gynecology | Admitting: Obstetrics and Gynecology

## 2022-02-02 DIAGNOSIS — N6311 Unspecified lump in the right breast, upper outer quadrant: Secondary | ICD-10-CM | POA: Diagnosis not present

## 2022-02-02 DIAGNOSIS — N6310 Unspecified lump in the right breast, unspecified quadrant: Secondary | ICD-10-CM

## 2022-02-02 DIAGNOSIS — R928 Other abnormal and inconclusive findings on diagnostic imaging of breast: Secondary | ICD-10-CM | POA: Diagnosis not present

## 2022-02-11 DIAGNOSIS — Z794 Long term (current) use of insulin: Secondary | ICD-10-CM | POA: Diagnosis not present

## 2022-02-11 DIAGNOSIS — E119 Type 2 diabetes mellitus without complications: Secondary | ICD-10-CM | POA: Diagnosis not present

## 2022-02-11 DIAGNOSIS — E0590 Thyrotoxicosis, unspecified without thyrotoxic crisis or storm: Secondary | ICD-10-CM | POA: Diagnosis not present

## 2022-02-11 DIAGNOSIS — I10 Essential (primary) hypertension: Secondary | ICD-10-CM | POA: Diagnosis not present

## 2022-02-11 LAB — HEMOGLOBIN A1C: Hemoglobin A1C: 6.2

## 2022-02-18 DIAGNOSIS — Z01411 Encounter for gynecological examination (general) (routine) with abnormal findings: Secondary | ICD-10-CM | POA: Diagnosis not present

## 2022-02-18 DIAGNOSIS — Z6841 Body Mass Index (BMI) 40.0 and over, adult: Secondary | ICD-10-CM | POA: Diagnosis not present

## 2022-02-18 DIAGNOSIS — Z01419 Encounter for gynecological examination (general) (routine) without abnormal findings: Secondary | ICD-10-CM | POA: Diagnosis not present

## 2022-02-18 DIAGNOSIS — Z113 Encounter for screening for infections with a predominantly sexual mode of transmission: Secondary | ICD-10-CM | POA: Diagnosis not present

## 2022-03-28 DIAGNOSIS — Z3169 Encounter for other general counseling and advice on procreation: Secondary | ICD-10-CM | POA: Diagnosis not present

## 2022-04-26 ENCOUNTER — Other Ambulatory Visit (HOSPITAL_COMMUNITY): Payer: Self-pay

## 2022-04-29 ENCOUNTER — Encounter: Payer: Self-pay | Admitting: Family Medicine

## 2022-04-29 ENCOUNTER — Ambulatory Visit (INDEPENDENT_AMBULATORY_CARE_PROVIDER_SITE_OTHER): Payer: BC Managed Care – PPO | Admitting: Family Medicine

## 2022-04-29 VITALS — BP 136/78 | HR 81 | Ht 62.0 in | Wt 306.2 lb

## 2022-04-29 DIAGNOSIS — Z23 Encounter for immunization: Secondary | ICD-10-CM | POA: Diagnosis not present

## 2022-04-29 DIAGNOSIS — E119 Type 2 diabetes mellitus without complications: Secondary | ICD-10-CM

## 2022-04-29 DIAGNOSIS — Z862 Personal history of diseases of the blood and blood-forming organs and certain disorders involving the immune mechanism: Secondary | ICD-10-CM

## 2022-04-29 DIAGNOSIS — Z0000 Encounter for general adult medical examination without abnormal findings: Secondary | ICD-10-CM

## 2022-04-29 DIAGNOSIS — Z86018 Personal history of other benign neoplasm: Secondary | ICD-10-CM | POA: Diagnosis not present

## 2022-04-29 LAB — CBC WITH DIFFERENTIAL/PLATELET
Basophils Absolute: 0 10*3/uL (ref 0.0–0.1)
Basophils Relative: 0.5 % (ref 0.0–3.0)
Eosinophils Absolute: 0.3 10*3/uL (ref 0.0–0.7)
Eosinophils Relative: 4.1 % (ref 0.0–5.0)
HCT: 33.3 % — ABNORMAL LOW (ref 36.0–46.0)
Hemoglobin: 10.8 g/dL — ABNORMAL LOW (ref 12.0–15.0)
Lymphocytes Relative: 36.7 % (ref 12.0–46.0)
Lymphs Abs: 2.4 10*3/uL (ref 0.7–4.0)
MCHC: 32.5 g/dL (ref 30.0–36.0)
MCV: 75.8 fl — ABNORMAL LOW (ref 78.0–100.0)
Monocytes Absolute: 0.3 10*3/uL (ref 0.1–1.0)
Monocytes Relative: 5.2 % (ref 3.0–12.0)
Neutro Abs: 3.5 10*3/uL (ref 1.4–7.7)
Neutrophils Relative %: 53.5 % (ref 43.0–77.0)
Platelets: 339 10*3/uL (ref 150.0–400.0)
RBC: 4.4 Mil/uL (ref 3.87–5.11)
RDW: 17.5 % — ABNORMAL HIGH (ref 11.5–15.5)
WBC: 6.5 10*3/uL (ref 4.0–10.5)

## 2022-04-29 LAB — HEPATIC FUNCTION PANEL
ALT: 14 U/L (ref 0–35)
AST: 14 U/L (ref 0–37)
Albumin: 4.2 g/dL (ref 3.5–5.2)
Alkaline Phosphatase: 61 U/L (ref 39–117)
Bilirubin, Direct: 0.1 mg/dL (ref 0.0–0.3)
Total Bilirubin: 0.5 mg/dL (ref 0.2–1.2)
Total Protein: 8.5 g/dL — ABNORMAL HIGH (ref 6.0–8.3)

## 2022-04-29 LAB — BASIC METABOLIC PANEL
BUN: 8 mg/dL (ref 6–23)
CO2: 30 mEq/L (ref 19–32)
Calcium: 9.6 mg/dL (ref 8.4–10.5)
Chloride: 102 mEq/L (ref 96–112)
Creatinine, Ser: 0.72 mg/dL (ref 0.40–1.20)
GFR: 103 mL/min (ref >60.00)
Glucose, Bld: 85 mg/dL (ref 70–99)
Potassium: 4.1 mEq/L (ref 3.5–5.1)
Sodium: 137 mEq/L (ref 135–145)

## 2022-04-29 LAB — LIPID PANEL
Cholesterol: 134 mg/dL (ref 0–200)
HDL: 49 mg/dL (ref >39.00)
LDL Cholesterol: 70 mg/dL (ref 0–99)
NonHDL: 84.68
Total CHOL/HDL Ratio: 3
Triglycerides: 72 mg/dL (ref 0.0–149.0)
VLDL: 14.4 mg/dL (ref 0.0–40.0)

## 2022-04-29 NOTE — Progress Notes (Signed)
Established Patient Office Visit  Subjective   Patient ID: Misty Salazar, female    DOB: 12/17/1980  Age: 42 y.o. MRN: [id number]  Chief Complaint  Patient presents with   Annual Exam    Physical     HPI   Misty Salazar is seen for well visit/physical.  She has history of morbid obesity, history of iron deficiency anemia, history of uterine fibroids, type 2 diabetes.  Currently followed by endocrinologist.  Recent A1c was 6.2%.  She is on Ozempic and just recently went to 1 mg once weekly.  She is not exercising regularly currently but just got an elliptical and plans to start that soon.  Her eye exams are up-to-date.  She sees gynecologist and mammogram and Pap smear are up-to-date.  Health maintenance reviewed  -As above, Pap smear mammogram up-to-date -Flu vaccine given last fall -Prior hepatitis C screening negative -Does need tetanus at this time  Family history-her mother had uterine cancer and type 2 diabetes.  Both parents had hypertension and hyperlipidemia.  Father also had type 2 diabetes.  Brother with hypertension.  Couple grandparents with stroke history.   Social history-married.  No children.  Rare alcohol.  Non-smoker.  Works from home.  [employer]  Past Medical History:  Diagnosis Date   Allergy    Anemia    COVID-19 2022   Diabetes mellitus without complication ([place])    Hypertension    Hyperthyroidism    Thyroid disease    hyperthyroidism   Past Surgical History:  Procedure Laterality Date   CHOLECYSTECTOMY  01/20/2011   Procedure: LAPAROSCOPIC CHOLECYSTECTOMY;  Surgeon: Judieth Keens, DO;  Location: [facility];  Service: General;  Laterality: N/A;   DILATATION & CURETTAGE/HYSTEROSCOPY WITH MYOSURE N/A 09/21/2021   Procedure: [place];  Surgeon: Crawford Givens, MD;  Location: [facility];  Service: Gynecology;  Laterality: N/A;  rep will be here confirmed on 09/13/21 CS   KNEE SURGERY  08/2007,  10/2009   right knee- remove fibroma    reports that she has never smoked. She has never used smokeless tobacco. She reports current alcohol use. She reports that she does not use drugs. family history includes Cancer in her maternal grandmother and mother; Diabetes in her father and mother; Hyperlipidemia in her father and mother; Hypertension in her brother, father, and mother; Stroke in her maternal grandfather, maternal grandmother, and paternal grandfather. Allergies  Allergen Reactions   Oxycodone-Acetaminophen Itching and Hives   Percocet [Oxycodone-Acetaminophen] Hives and Itching    Review of Systems  Constitutional:  Negative for chills, fever, malaise/fatigue and weight loss.  HENT:  Negative for hearing loss.   Eyes:  Negative for blurred vision and double vision.  Respiratory:  Negative for cough and shortness of breath.   Cardiovascular:  Negative for chest pain, palpitations and leg swelling.  Gastrointestinal:  Negative for abdominal pain, blood in stool, constipation and diarrhea.  Genitourinary:  Negative for dysuria.  Skin:  Negative for rash.  Neurological:  Negative for dizziness, speech change, seizures, loss of consciousness and headaches.  Psychiatric/Behavioral:  Negative for depression.       Objective:     BP 136/78   Pulse 81   Ht 5\' 2"  (1.575 m)   Wt (!) 306 lb 3.2 oz (138.9 kg)   SpO2 97%   BMI 56.00 kg/m  BP Readings from Last 3 Encounters:  04/29/22 136/78  09/21/21 139/86  04/26/21 (!) 148/90   Wt Readings from Last  3 Encounters:  04/29/22 (!) 306 lb 3.2 oz (138.9 kg)  09/21/21 (!) 305 lb (138.3 kg)  04/26/21 (!) 307 lb 14.4 oz (139.7 kg)      Physical Exam Vitals reviewed.  Constitutional:      Appearance: She is well-developed.  HENT:     Head: Normocephalic and atraumatic.     Mouth/Throat:     Mouth: Mucous membranes are moist.     Pharynx: Oropharynx is clear.  Neck:     Thyroid: No thyromegaly.  Cardiovascular:     Rate  and Rhythm: Normal rate and regular rhythm.     Heart sounds: Normal heart sounds. No murmur heard. Pulmonary:     Effort: No respiratory distress.     Breath sounds: Normal breath sounds. No wheezing or rales.  Abdominal:     General: Bowel sounds are normal. There is no distension.     Palpations: Abdomen is soft. There is no mass.     Tenderness: There is no abdominal tenderness. There is no guarding or rebound.  Musculoskeletal:        General: Normal range of motion.     Cervical back: Normal range of motion and neck supple.  Lymphadenopathy:     Cervical: No cervical adenopathy.  Skin:    Findings: No rash.  Neurological:     Mental Status: She is alert and oriented to person, place, and time.     Cranial Nerves: No cranial nerve deficit.  Psychiatric:        Behavior: Behavior normal.        Thought Content: Thought content normal.        Judgment: Judgment normal.      Results for orders placed or performed in visit on 04/29/22  Hemoglobin A1c  Result Value Ref Range   Hemoglobin A1C 6.2     Last CBC Lab Results  Component Value Date   WBC 5.9 09/21/2021   HGB 9.4 (L) 09/21/2021   HCT 29.5 (L) 09/21/2021   MCV 80.4 09/21/2021   MCH 25.6 (L) 09/21/2021   RDW 14.9 09/21/2021   PLT 332 [id number]   Last metabolic panel Lab Results  Component Value Date   GLUCOSE 110 (H) 09/21/2021   NA 138 09/21/2021   K 3.3 (L) 09/21/2021   CL 106 09/21/2021   CO2 24 09/21/2021   BUN 7 09/21/2021   CREATININE 0.69 09/21/2021   GFRNONAA >60 09/21/2021   CALCIUM 8.8 (L) 09/21/2021   PROT 8.0 04/22/2020   ALBUMIN 4.0 04/22/2020   BILITOT 0.6 04/22/2020   ALKPHOS 60 04/22/2020   AST 11 04/22/2020   ALT 14 04/22/2020   ANIONGAP 8 09/21/2021   Last lipids Lab Results  Component Value Date   CHOL 114 04/26/2021   HDL 45.90 04/26/2021   LDLCALC 53 04/26/2021   TRIG 75.0 04/26/2021   CHOLHDL 2 04/26/2021   Last hemoglobin A1c Lab Results  Component Value Date    HGBA1C 6.2 02/11/2022   Last thyroid functions Lab Results  Component Value Date   TSH 1.30 04/22/2020      The ASCVD Risk score (Arnett DK, et al., 2019) failed to calculate for the following reasons:   The valid total cholesterol range is 130 to 320 mg/dL    Assessment & Plan:   Physical exam.  She has chronic problems including morbid obesity, hypertension, type 2 diabetes.  Followed by endocrinology.  We discussed the following health maintenance items  -She knows she needs to  lose some weight and hopefully will have further success with Ozempic with recent titration as above. -Try to establish more consistent exercise -Obtain screening labs.  She had recent A1c in thyroid through endocrinology -Continue yearly diabetic eye exam -Tdap given -She plans to continue GYN follow-up for Pap smears and mammograms   No follow-ups on file.    Carolann Littler, MD

## 2022-05-02 NOTE — Addendum Note (Signed)
Addended by: Nilda Riggs on: 05/02/2022 08:58 AM   Modules accepted: Orders

## 2022-05-23 ENCOUNTER — Other Ambulatory Visit (INDEPENDENT_AMBULATORY_CARE_PROVIDER_SITE_OTHER): Payer: BC Managed Care – PPO

## 2022-05-23 DIAGNOSIS — Z862 Personal history of diseases of the blood and blood-forming organs and certain disorders involving the immune mechanism: Secondary | ICD-10-CM

## 2022-05-23 LAB — IBC + FERRITIN
Ferritin: 22.8 ng/mL (ref 10.0–291.0)
Iron: 33 ug/dL — ABNORMAL LOW (ref 42–145)
Saturation Ratios: 8.7 % — ABNORMAL LOW (ref 20.0–50.0)
TIBC: 380.8 ug/dL (ref 250.0–450.0)
Transferrin: 272 mg/dL (ref 212.0–360.0)

## 2022-05-24 ENCOUNTER — Other Ambulatory Visit: Payer: Self-pay | Admitting: Family

## 2022-05-24 ENCOUNTER — Telehealth: Payer: Self-pay | Admitting: Hematology and Oncology

## 2022-05-24 DIAGNOSIS — Z862 Personal history of diseases of the blood and blood-forming organs and certain disorders involving the immune mechanism: Secondary | ICD-10-CM

## 2022-05-24 DIAGNOSIS — D508 Other iron deficiency anemias: Secondary | ICD-10-CM

## 2022-05-24 NOTE — Telephone Encounter (Signed)
scheduled per 4/16 referral , pt has been called and confirmed date and time. Pt is aware of location and to arrive early for check in   

## 2022-05-26 ENCOUNTER — Encounter: Payer: Self-pay | Admitting: Hematology and Oncology

## 2022-05-26 ENCOUNTER — Inpatient Hospital Stay: Payer: BC Managed Care – PPO | Attending: Hematology and Oncology | Admitting: Hematology and Oncology

## 2022-05-26 ENCOUNTER — Inpatient Hospital Stay: Payer: BC Managed Care – PPO

## 2022-05-26 VITALS — BP 127/42 | HR 102 | Temp 99.5°F | Resp 16 | Ht 62.0 in | Wt 307.8 lb

## 2022-05-26 DIAGNOSIS — Z862 Personal history of diseases of the blood and blood-forming organs and certain disorders involving the immune mechanism: Secondary | ICD-10-CM | POA: Diagnosis not present

## 2022-05-26 DIAGNOSIS — D509 Iron deficiency anemia, unspecified: Secondary | ICD-10-CM | POA: Insufficient documentation

## 2022-05-26 DIAGNOSIS — Z86018 Personal history of other benign neoplasm: Secondary | ICD-10-CM | POA: Diagnosis not present

## 2022-05-26 DIAGNOSIS — D539 Nutritional anemia, unspecified: Secondary | ICD-10-CM | POA: Diagnosis not present

## 2022-05-26 NOTE — Progress Notes (Signed)
[facility] CONSULT NOTE  Patient Care Team: Kristian Covey, MD as PCP - General  ASSESSMENT & PLAN:  IRON DEFICIENCY ANEMIA, HX OF I have reviewed her blood work and discussed the risk and benefits of oral iron versus intravenous iron infusion After much discussion, she is in agreement to modify the way she takes oral iron and defer IV iron for now I plan to see her again in 3 months with blood work to be done ahead of time If she makes no progress with improving her anemia with oral iron supplement taken differently, we will proceed with intravenous iron infusion in the future  Obesity, Class III, BMI 40-49.9 (morbid obesity) She is doing well with Ozempic and is losing weight I would defer to her prescribing physician for management  History of uterine fibroid Since myomectomy, she has less heavy menstruation We will observe closely for now   Orders Placed This Encounter  Procedures   CBC with Differential (Cancer Center Only)    Standing Status:   Future    Standing Expiration Date:   05/26/2023   Iron and Iron Binding Capacity ([facility] only)    Standing Status:   Future    Standing Expiration Date:   05/26/2023   Ferritin    Standing Status:   Future    Standing Expiration Date:   05/26/2023   Sedimentation rate    Standing Status:   Future    Standing Expiration Date:   05/26/2023   Reticulocytes    Standing Status:   Future    Standing Expiration Date:   05/26/2023   Vitamin B12    Standing Status:   Future    Standing Expiration Date:   05/26/2023    All questions were answered. The patient knows to call the clinic with any problems, questions or concerns.  The total time spent in the appointment was 55 minutes encounter with patients including review of chart and various tests results, discussions about plan of care and coordination of care plan  Artis Delay, MD 4/18/20242:33 PM   CHIEF COMPLAINTS/PURPOSE OF CONSULTATION:  Anemia  HISTORY OF  PRESENTING ILLNESS:  Misty Salazar 42 y.o. female is here because of anemia  She was found to have abnormal CBC from recent blood work I have the opportunity to review her blood work dated back to 2009 She had normal hemoglobin in 2020 with hemoglobin greater than 12 Most recently, she is anemic with microcytosis, confirmed to be iron deficient with iron panel The patient has been anemic for many years  She denies recent chest pain on exertion, shortness of breath on minimal exertion, pre-syncopal episodes, or palpitations. She had not noticed any recent bleeding such as epistaxis, hematuria or hematochezia The patient takes occasional over the counter NSAID. She is not on antiplatelets agents. Her last colonoscopy was in 2003 when she presented with hematochezia but EGD and colonoscopy was normal She had no prior history or diagnosis of cancer. Her age appropriate screening programs are up-to-date. She denies any pica and eats a variety of diet. She never donated blood or received blood transfusion The patient was prescribed oral iron supplements and she takes daily with breakfast She had history of menorrhagia culminating to [facility] and myomectomy last year.  The patient bled every day between April to August Since her myomectomy, her.  Since less frequent and lighter  MEDICAL HISTORY:  Past Medical History:  Diagnosis Date   Allergy    Anemia  COVID-19 2022   Diabetes mellitus without complication    Hypertension    Hyperthyroidism    Thyroid disease    hyperthyroidism    SURGICAL HISTORY: Past Surgical History:  Procedure Laterality Date   CHOLECYSTECTOMY  01/20/2011   Procedure: LAPAROSCOPIC CHOLECYSTECTOMY;  Surgeon: Rulon Abide, DO;  Location: [facility];  Service: General;  Laterality: N/A;   DILATATION & CURETTAGE/HYSTEROSCOPY WITH MYOSURE N/A 09/21/2021   Procedure: DILATATION & CURETTAGE/HYSTEROSCOPY WITH MYOSURE;  Surgeon: Jaymes Graff, MD;  Location: [facility]  OR;  Service: Gynecology;  Laterality: N/A;  rep will be here confirmed on 09/13/21 CS   KNEE SURGERY  08/2007, 10/2009   right knee- remove fibroma    SOCIAL HISTORY: Social History   Socioeconomic History   Marital status: Married    Spouse name: Not on file   Number of children: Not on file   Years of education: Not on file   Highest education level: Bachelor's degree (e.g., BA, AB, BS)  Occupational History   Not on file  Tobacco Use   Smoking status: Never   Smokeless tobacco: Never  Vaping Use   Vaping Use: Never used  Substance and Sexual Activity   Alcohol use: Yes    Alcohol/week: 0.0 standard drinks of alcohol    Comment: occasional    Drug use: No   Sexual activity: Not on file  Other Topics Concern   Not on file  Social History Narrative   Not on file   Social Determinants of Health   Financial Resource Strain: Low Risk  (12/24/2020)   Overall Financial Resource Strain (CARDIA)    Difficulty of Paying Living Expenses: Not very hard  Food Insecurity: No Food Insecurity (12/24/2020)   Hunger Vital Sign    Worried About Running Out of Food in the Last Year: Never true    Ran Out of Food in the Last Year: Never true  Transportation Needs: No Transportation Needs (12/24/2020)   PRAPARE - Administrator, Civil Service (Medical): No    Lack of Transportation (Non-Medical): No  Physical Activity: Sufficiently Active (12/24/2020)   Exercise Vital Sign    Days of Exercise per Week: 3 days    Minutes of Exercise per Session: 50 min  Stress: No Stress Concern Present (12/24/2020)   [employer] of Occupational Health - Occupational Stress Questionnaire    Feeling of Stress : Not at all  Social Connections: Moderately Integrated (12/24/2020)   Social Connection and Isolation Panel [NHANES]    Frequency of Communication with Friends and Family: More than three times a week    Frequency of Social Gatherings with Friends and Family: Once a week     Attends Religious Services: More than 4 times per year    Active Member of [employer] or Organizations: No    Attends Engineer, structural: Not on file    Marital Status: Married  Catering manager Violence: Not on file    FAMILY HISTORY: Family History  Problem Relation Age of Onset   Hypertension Mother    Hyperlipidemia Mother    Cancer Mother        uterine cancer   Diabetes Mother    Diabetes Father    Hypertension Father    Hyperlipidemia Father    Cancer Maternal Grandmother        cervical   Stroke Maternal Grandmother    Stroke Maternal Grandfather    Stroke Paternal Grandfather    Hypertension Brother    Breast  cancer Neg Hx     ALLERGIES:  is allergic to oxycodone-acetaminophen and percocet [oxycodone-acetaminophen].  MEDICATIONS:  Current Outpatient Medications  Medication Sig Dispense Refill   amLODipine (NORVASC) 10 MG tablet Take 10 mg by mouth daily.     cetirizine (ZYRTEC) 10 MG tablet Take 10 mg by mouth daily.     ferrous sulfate 325 (65 FE) MG tablet Take 325 mg by mouth daily with breakfast.     ketotifen (ALAWAY) 0.025 % ophthalmic solution Place 1 drop into both eyes daily as needed (allergies).     lisinopril (ZESTRIL) 5 MG tablet Take 5 mg by mouth daily.     Multiple Vitamin (MULTIVITAMIN) tablet Take 1 tablet by mouth daily.     OZEMPIC, 1 MG/DOSE, 4 MG/3ML SOPN Inject 1 mg into the skin once a week.     triamcinolone (NASACORT ALLERGY 24HR) 55 MCG/ACT AERO nasal inhaler Place 1 spray into the nose daily as needed (allergies).     No current facility-administered medications for this visit.    REVIEW OF SYSTEMS: She had recent weight loss due to medication with Ozempic Constitutional: Denies fevers, chills or abnormal night sweats Eyes: Denies blurriness of vision, double vision or watery eyes Ears, nose, mouth, throat, and face: Denies mucositis or sore throat Respiratory: Denies cough, dyspnea or wheezes Cardiovascular: Denies  palpitation, chest discomfort or lower extremity swelling Gastrointestinal:  Denies nausea, heartburn or change in bowel habits Skin: Denies abnormal skin rashes Lymphatics: Denies new lymphadenopathy or easy bruising Neurological:Denies numbness, tingling or new weaknesses Behavioral/Psych: Mood is stable, no new changes  All other systems were reviewed with the patient and are negative.  PHYSICAL EXAMINATION: ECOG PERFORMANCE STATUS: 0 - Asymptomatic  Vitals:   05/26/22 1402  BP: (!) 127/42  Pulse: (!) 102  Resp: 16  Temp: 99.5 F (37.5 C)  SpO2: 100%   Filed Weights   05/26/22 1402  Weight: (!) 307 lb 12.8 oz (139.6 kg)    GENERAL:alert, no distress and comfortable SKIN: skin color, texture, turgor are normal, no rashes or significant lesions.  Noted skin discoloration consistent with acanthosis nigricans EYES: normal, conjunctiva are pink and non-injected, sclera clear OROPHARYNX:no exudate, no erythema and lips, buccal mucosa, and tongue normal  NECK: supple, thyroid normal size, non-tender, without nodularity LYMPH:  no palpable lymphadenopathy in the cervical, axillary or inguinal LUNGS: clear to auscultation and percussion with normal breathing effort HEART: regular rate & rhythm and no murmurs and no lower extremity edema ABDOMEN:abdomen soft, non-tender and normal bowel sounds limited examination due to abdominal adiposity Musculoskeletal:no cyanosis of digits and no clubbing  PSYCH: alert & oriented x 3 with fluent speech NEURO: no focal motor/sensory deficits

## 2022-05-26 NOTE — Assessment & Plan Note (Signed)
Since myomectomy, she has less heavy menstruation We will observe closely for now

## 2022-05-26 NOTE — Assessment & Plan Note (Signed)
She is doing well with Ozempic and is losing weight I would defer to her prescribing physician for management

## 2022-05-26 NOTE — Assessment & Plan Note (Signed)
I have reviewed her blood work and discussed the risk and benefits of oral iron versus intravenous iron infusion After much discussion, she is in agreement to modify the way she takes oral iron and defer IV iron for now I plan to see her again in 3 months with blood work to be done ahead of time If she makes no progress with improving her anemia with oral iron supplement taken differently, we will proceed with intravenous iron infusion in the future

## 2022-06-02 ENCOUNTER — Other Ambulatory Visit: Payer: BC Managed Care – PPO

## 2022-06-13 DIAGNOSIS — E119 Type 2 diabetes mellitus without complications: Secondary | ICD-10-CM | POA: Diagnosis not present

## 2022-06-13 DIAGNOSIS — Z7984 Long term (current) use of oral hypoglycemic drugs: Secondary | ICD-10-CM | POA: Diagnosis not present

## 2022-07-15 ENCOUNTER — Encounter: Payer: Self-pay | Admitting: Family Medicine

## 2022-07-15 ENCOUNTER — Ambulatory Visit (INDEPENDENT_AMBULATORY_CARE_PROVIDER_SITE_OTHER): Payer: BC Managed Care – PPO | Admitting: Family Medicine

## 2022-07-15 VITALS — BP 142/78 | HR 88 | Temp 98.3°F | Ht 62.0 in | Wt 305.3 lb

## 2022-07-15 DIAGNOSIS — R053 Chronic cough: Secondary | ICD-10-CM | POA: Diagnosis not present

## 2022-07-15 DIAGNOSIS — R0982 Postnasal drip: Secondary | ICD-10-CM

## 2022-07-15 DIAGNOSIS — J309 Allergic rhinitis, unspecified: Secondary | ICD-10-CM

## 2022-07-15 MED ORDER — AZELASTINE HCL 0.1 % NA SOLN: 2.0000 | Freq: Two times a day (BID) | NASAL | 5 refills | Status: DC

## 2022-07-15 NOTE — Progress Notes (Signed)
Established Patient Office Visit  Subjective   Patient ID: Misty Salazar, female    DOB: [birth year]  Age: 42 y.o. MRN: [id number]  Chief Complaint  Patient presents with   Cough    Patient complains of cough, Productive, Tried Flonase and Zyrtec    HPI   Misty Salazar is seen with chronic cough since April and frequent postnasal drip symptoms.  She has history of some perennial allergies.  She takes Zyrtec and Flonase daily.  No GERD symptoms.  Non-smoker.  No hemoptysis.  No fever.  No dyspnea.  No obvious wheezing.  She does recall being placed on low-dose lisinopril per endocrinology around April.  Her cough is sometimes productive.  She feels like she is having frequent postnasal drip symptoms.  Past Medical History:  Diagnosis Date   Allergy    Anemia    COVID-19 2022   Diabetes mellitus without complication (HCC)    Hypertension    Hyperthyroidism    Thyroid disease    hyperthyroidism   Past Surgical History:  Procedure Laterality Date   CHOLECYSTECTOMY  01/20/2011   Procedure: LAPAROSCOPIC CHOLECYSTECTOMY;  Surgeon: Rulon Abide, DO;  Location: [facility];  Service: General;  Laterality: N/A;   DILATATION & CURETTAGE/HYSTEROSCOPY WITH MYOSURE N/A 09/21/2021   Procedure: DILATATION & CURETTAGE/HYSTEROSCOPY WITH MYOSURE;  Surgeon: Jaymes Graff, MD;  Location: [facility] OR;  Service: Gynecology;  Laterality: N/A;  rep will be here confirmed on 09/13/21 CS   KNEE SURGERY  08/2007, 10/2009   right knee- remove fibroma    reports that she has never smoked. She has never used smokeless tobacco. She reports current alcohol use. She reports that she does not use drugs. family history includes Cancer in her maternal grandmother and mother; Diabetes in her father and mother; Hyperlipidemia in her father and mother; Hypertension in her brother, father, and mother; Stroke in her maternal grandfather, maternal grandmother, and paternal grandfather. Allergies  Allergen Reactions    Oxycodone-Acetaminophen Itching and Hives   Percocet [Oxycodone-Acetaminophen] Hives and Itching    Review of Systems  Constitutional:  Negative for chills, fever and weight loss.  HENT:  Positive for congestion. Negative for sinus pain.   Respiratory:  Positive for cough and sputum production. Negative for hemoptysis, shortness of breath and wheezing.       Objective:     BP (!) 142/78 (BP Location: Left Arm, Patient Position: Sitting, Cuff Size: Large)   Pulse 88   Temp 98.3 F (36.8 C) (Oral)   Ht 5\' 2"  (1.575 m)   Wt (!) 305 lb 4.8 oz (138.5 kg)   SpO2 99%   BMI 55.84 kg/m  BP Readings from Last 3 Encounters:  07/15/22 (!) 142/78  05/26/22 (!) 127/42  04/29/22 136/78   Wt Readings from Last 3 Encounters:  07/15/22 (!) 305 lb 4.8 oz (138.5 kg)  05/26/22 (!) 307 lb 12.8 oz (139.6 kg)  04/29/22 (!) 306 lb 3.2 oz (138.9 kg)      Physical Exam Vitals reviewed.  Constitutional:      Appearance: Normal appearance.  Cardiovascular:     Rate and Rhythm: Normal rate and regular rhythm.  Pulmonary:     Effort: Pulmonary effort is normal.     Breath sounds: Normal breath sounds. No wheezing or rales.  Musculoskeletal:     Cervical back: Neck supple.  Lymphadenopathy:     Cervical: No cervical adenopathy.  Neurological:     Mental Status: She is alert.  No results found for any visits on 07/15/22.    The 10-year ASCVD risk score (Arnett DK, et al., 2019) is: 6.3%    Assessment & Plan:   Problem List Items Addressed This Visit   None Visit Diagnoses     Chronic cough    -  Primary     Suspect at least some component of allergic postnasal drip.  She also was recently started on ACE inhibitor.  Continue with her Flonase and Zyrtec and will add Astelin nasal 1 to 2 sprays per nostril twice daily.  If cough not significantly improved in the next few weeks recommend she follow-up with her endocrinologist to consider coming off lisinopril possibly to the  AR[facility]  No follow-ups on file.    Evelena Peat, MD

## 2022-07-15 NOTE — Patient Instructions (Signed)
If cough not improving over the next couple of weeks, let your endocrinologist know as this cough might be related to ACE inhibitor.

## 2022-08-19 ENCOUNTER — Other Ambulatory Visit: Payer: Self-pay

## 2022-08-19 ENCOUNTER — Inpatient Hospital Stay: Payer: BC Managed Care – PPO | Attending: Hematology and Oncology

## 2022-08-19 DIAGNOSIS — D539 Nutritional anemia, unspecified: Secondary | ICD-10-CM

## 2022-08-19 DIAGNOSIS — D509 Iron deficiency anemia, unspecified: Secondary | ICD-10-CM | POA: Insufficient documentation

## 2022-08-19 LAB — CBC WITH DIFFERENTIAL (CANCER CENTER ONLY)
Abs Immature Granulocytes: 0.02 10*3/uL (ref 0.00–0.07)
Basophils Absolute: 0 10*3/uL (ref 0.0–0.1)
Basophils Relative: 0 %
Eosinophils Absolute: 0.1 10*3/uL (ref 0.0–0.5)
Eosinophils Relative: 2 %
HCT: 32.5 % — ABNORMAL LOW (ref 36.0–46.0)
Hemoglobin: 10.7 g/dL — ABNORMAL LOW (ref 12.0–15.0)
Immature Granulocytes: 0 %
Lymphocytes Relative: 31 %
Lymphs Abs: 2.1 10*3/uL (ref 0.7–4.0)
MCH: 25.5 pg — ABNORMAL LOW (ref 26.0–34.0)
MCHC: 32.9 g/dL (ref 30.0–36.0)
MCV: 77.6 fL — ABNORMAL LOW (ref 80.0–100.0)
Monocytes Absolute: 0.3 10*3/uL (ref 0.1–1.0)
Monocytes Relative: 5 %
Neutro Abs: 4 10*3/uL (ref 1.7–7.7)
Neutrophils Relative %: 62 %
Platelet Count: 321 10*3/uL (ref 150–400)
RBC: 4.19 MIL/uL (ref 3.87–5.11)
RDW: 15.9 % — ABNORMAL HIGH (ref 11.5–15.5)
WBC Count: 6.5 10*3/uL (ref 4.0–10.5)
nRBC: 0 % (ref 0.0–0.2)

## 2022-08-19 LAB — RETICULOCYTES
Immature Retic Fract: 13.6 % (ref 2.3–15.9)
RBC.: 4.18 MIL/uL (ref 3.87–5.11)
Retic Count, Absolute: 53.9 10*3/uL (ref 19.0–186.0)
Retic Ct Pct: 1.3 % (ref 0.4–3.1)

## 2022-08-19 LAB — IRON AND IRON BINDING CAPACITY (CC-WL,HP ONLY)
Iron: 52 ug/dL (ref 28–170)
Saturation Ratios: 13 % (ref 10.4–31.8)
TIBC: 396 ug/dL (ref 250–450)
UIBC: 344 ug/dL (ref 148–442)

## 2022-08-19 LAB — FERRITIN: Ferritin: 35 ng/mL (ref 11–307)

## 2022-08-19 LAB — SEDIMENTATION RATE: Sed Rate: 53 mm/hr — ABNORMAL HIGH (ref 0–22)

## 2022-08-19 LAB — VITAMIN B12: Vitamin B-12: 249 pg/mL (ref 180–914)

## 2022-08-23 ENCOUNTER — Inpatient Hospital Stay: Payer: BC Managed Care – PPO | Admitting: Hematology and Oncology

## 2022-08-23 ENCOUNTER — Encounter: Payer: Self-pay | Admitting: Hematology and Oncology

## 2022-08-23 ENCOUNTER — Telehealth: Payer: Self-pay | Admitting: Hematology and Oncology

## 2022-08-23 DIAGNOSIS — Z86018 Personal history of other benign neoplasm: Secondary | ICD-10-CM

## 2022-08-23 DIAGNOSIS — D539 Nutritional anemia, unspecified: Secondary | ICD-10-CM | POA: Diagnosis not present

## 2022-08-23 DIAGNOSIS — Z862 Personal history of diseases of the blood and blood-forming organs and certain disorders involving the immune mechanism: Secondary | ICD-10-CM

## 2022-08-23 NOTE — Progress Notes (Signed)
HEMATOLOGY-ONCOLOGY ELECTRONIC VISIT PROGRESS NOTE  Patient Care Team: Kristian Covey, MD as PCP - General  I connected with the patient via telephone conference and verified that I am speaking with the correct person using two identifiers. The patient's location is at home and I am providing care from the [facility] I discussed the limitations, risks, security and privacy concerns of performing an evaluation and management service by e-visits and the availability of in person appointments.  I also discussed with the patient that there may be a patient responsible charge related to this service. The patient expressed understanding and agreed to proceed.   ASSESSMENT & PLAN:  IRON DEFICIENCY ANEMIA, HX OF She has not made tremendous progress with oral iron supplement She has symptoms of fatigue The most likely cause of her anemia is due to chronic blood loss/malabsorption syndrome. We discussed some of the risks, benefits, and alternatives of intravenous iron infusions. The patient is symptomatic from anemia and the iron level is critically low. She tolerated oral iron supplement poorly and desires to achieved higher levels of iron faster for adequate hematopoesis. Some of the side-effects to be expected including risks of infusion reactions, phlebitis, headaches, nausea and fatigue.  The patient is willing to proceed. Patient education material was dispensed.  Goal is to keep ferritin level greater than 50 and resolution of anemia She agreed to proceed with 2 doses of intravenous iron infusion Will set up return visit in 3 months with repeat iron studies  History of uterine fibroid Since myomectomy, she has less heavy menstruation We will observe closely for now  Orders Placed This Encounter  Procedures   Iron and Iron Binding Capacity ([facility] only)    Standing Status:   Future    Standing Expiration Date:   08/23/2023   Ferritin    Standing Status:   Future    Standing  Expiration Date:   08/23/2023   CBC with Differential/Platelet    Standing Status:   Standing    Number of Occurrences:   22    Standing Expiration Date:   08/23/2023   Sedimentation rate    Standing Status:   Future    Standing Expiration Date:   08/23/2023    INTERVAL HISTORY: Please see below for problem oriented charting. The purpose of today's discussion is to review test results and discussed risk and benefits of intravenous iron infusion She has some mild fatigue  HISTORY OF PRESENTING ILLNESS:  Misty Salazar 42 y.o. female is here because of anemia  She was found to have abnormal CBC from recent blood work I have the opportunity to review her blood work dated back to 2009 She had normal hemoglobin in 2020 with hemoglobin greater than 12 Most recently, she is anemic with microcytosis, confirmed to be iron deficient with iron panel The patient has been anemic for many years  She denies recent chest pain on exertion, shortness of breath on minimal exertion, pre-syncopal episodes, or palpitations. She had not noticed any recent bleeding such as epistaxis, hematuria or hematochezia The patient takes occasional over the counter NSAID. She is not on antiplatelets agents. Her last colonoscopy was in 2003 when she presented with hematochezia but EGD and colonoscopy was normal She had no prior history or diagnosis of cancer. Her age appropriate screening programs are up-to-date. She denies any pica and eats a variety of diet. She never donated blood or received blood transfusion The patient was prescribed oral iron supplements and she takes daily  with breakfast She had history of menorrhagia culminating to [facility] and myomectomy last year.  The patient bled every day between April to August Since her myomectomy, her.  Since less frequent and lighter   REVIEW OF SYSTEMS:   Constitutional: Denies fevers, chills or abnormal weight loss Eyes: Denies blurriness of vision Ears, nose,  mouth, throat, and face: Denies mucositis or sore throat Respiratory: Denies cough, dyspnea or wheezes Cardiovascular: Denies palpitation, chest discomfort Gastrointestinal:  Denies nausea, heartburn or change in bowel habits Skin: Denies abnormal skin rashes Lymphatics: Denies new lymphadenopathy or easy bruising Neurological:Denies numbness, tingling or new weaknesses Behavioral/Psych: Mood is stable, no new changes  Extremities: No lower extremity edema All other systems were reviewed with the patient and are negative.  I have reviewed the past medical history, past surgical history, social history and family history with the patient and they are unchanged from previous note.  ALLERGIES:  is allergic to oxycodone-acetaminophen and percocet [oxycodone-acetaminophen].  MEDICATIONS:  Current Outpatient Medications  Medication Sig Dispense Refill   amLODipine (NORVASC) 10 MG tablet Take 10 mg by mouth daily.     azelastine (ASTELIN) 0.1 % nasal spray Place 2 sprays into both nostrils 2 (two) times daily. Use in each nostril as directed 30 mL 5   cetirizine (ZYRTEC) 10 MG tablet Take 10 mg by mouth daily.     ferrous sulfate 325 (65 FE) MG tablet Take 325 mg by mouth daily with breakfast.     ketotifen (ALAWAY) 0.025 % ophthalmic solution Place 1 drop into both eyes daily as needed (allergies).     lisinopril (ZESTRIL) 5 MG tablet Take 5 mg by mouth daily.     Multiple Vitamin (MULTIVITAMIN) tablet Take 1 tablet by mouth daily.     OZEMPIC, 1 MG/DOSE, 4 MG/3ML SOPN Inject 1 mg into the skin once a week.     triamcinolone (NASACORT ALLERGY 24HR) 55 MCG/ACT AERO nasal inhaler Place 1 spray into the nose daily as needed (allergies).     No current facility-administered medications for this visit.    PHYSICAL EXAMINATION: ECOG PERFORMANCE STATUS: 1 - Symptomatic but completely ambulatory  LABORATORY DATA:  I have reviewed the data as listed    Latest Ref Rng & Units 04/29/2022   11:06  AM 09/21/2021    7:01 AM 04/22/2020    9:38 AM  CMP  Glucose 70 - 99 mg/dL 85  161  096   BUN 6 - 23 mg/dL 8  7  7    Creatinine 0.40 - 1.20 mg/dL 0.45  4.09  8.11   Sodium 135 - 145 mEq/L 137  138  137   Potassium 3.5 - 5.1 mEq/L 4.1  3.3  4.1   Chloride 96 - 112 mEq/L 102  106  102   CO2 19 - 32 mEq/L 30  24  28    Calcium 8.4 - 10.5 mg/dL 9.6  8.8  9.6   Total Protein 6.0 - 8.3 g/dL 8.5   8.0   Total Bilirubin 0.2 - 1.2 mg/dL 0.5   0.6   Alkaline Phos 39 - 117 U/L 61   60   AST 0 - 37 U/L 14   11   ALT 0 - 35 U/L 14   14     Lab Results  Component Value Date   WBC 6.5 08/19/2022   HGB 10.7 (L) 08/19/2022   HCT 32.5 (L) 08/19/2022   MCV 77.6 (L) 08/19/2022   PLT 321 08/19/2022   NEUTROABS 4.0  08/19/2022     I discussed the assessment and treatment plan with the patient. The patient was provided an opportunity to ask questions and all were answered. The patient agreed with the plan and demonstrated an understanding of the instructions. The patient was advised to call back or seek an in-person evaluation if the symptoms worsen or if the condition fails to improve as anticipated.    I spent 30 minutes for the appointment reviewing test results, discuss management and coordination of care.  Artis Delay, MD 08/23/2022 11:25 AM

## 2022-08-23 NOTE — Assessment & Plan Note (Signed)
She has not made tremendous progress with oral iron supplement She has symptoms of fatigue The most likely cause of her anemia is due to chronic blood loss/malabsorption syndrome. We discussed some of the risks, benefits, and alternatives of intravenous iron infusions. The patient is symptomatic from anemia and the iron level is critically low. She tolerated oral iron supplement poorly and desires to achieved higher levels of iron faster for adequate hematopoesis. Some of the side-effects to be expected including risks of infusion reactions, phlebitis, headaches, nausea and fatigue.  The patient is willing to proceed. Patient education material was dispensed.  Goal is to keep ferritin level greater than 50 and resolution of anemia She agreed to proceed with 2 doses of intravenous iron infusion Will set up return visit in 3 months with repeat iron studies

## 2022-08-23 NOTE — Telephone Encounter (Signed)
Spoke with patient confirming upcoming appointment  

## 2022-08-23 NOTE — Assessment & Plan Note (Signed)
Since myomectomy, she has less heavy menstruation We will observe closely for now

## 2022-09-12 ENCOUNTER — Encounter: Payer: Self-pay | Admitting: Family Medicine

## 2022-09-12 ENCOUNTER — Ambulatory Visit (INDEPENDENT_AMBULATORY_CARE_PROVIDER_SITE_OTHER): Payer: BC Managed Care – PPO | Admitting: Family Medicine

## 2022-09-12 VITALS — BP 140/80 | HR 80 | Temp 98.1°F | Ht 62.0 in | Wt 304.6 lb

## 2022-09-12 DIAGNOSIS — S46911A Strain of unspecified muscle, fascia and tendon at shoulder and upper arm level, right arm, initial encounter: Secondary | ICD-10-CM

## 2022-09-12 MED ORDER — MELOXICAM 15 MG PO TABS: 15.0000 mg | ORAL_TABLET | Freq: Every day | ORAL | 0 refills | Status: DC

## 2022-09-12 NOTE — Progress Notes (Signed)
Established Patient Office Visit  Subjective   Patient ID: Misty Salazar, female    DOB: 12-29-1980  Age: 42 y.o. MRN: [id number]  Chief Complaint  Patient presents with   Shoulder Pain    HPI   Misty Salazar is seen with right shoulder pain.  Denies any specific injury.  However, Misty Salazar and her husband were in [place] couple weeks ago had a amateur Education officer, environmental.  Misty Salazar carried bags weighing several pounds carrying 3 bowling balls through the airport.  Misty Salazar also bowled multiple games in a.  Of several days.  Misty Salazar noticed soreness in her shoulder afterwards and pain is worse with adduction around the [facility] joint region.  Denies any cervical radiculitis symptoms.  No shoulder weakness.  No pain with abduction.  Past Medical History:  Diagnosis Date   Allergy    Anemia    COVID-19 2022   Diabetes mellitus without complication (HCC)    Hypertension    Hyperthyroidism    Thyroid disease    hyperthyroidism   Past Surgical History:  Procedure Laterality Date   CHOLECYSTECTOMY  01/20/2011   Procedure: LAPAROSCOPIC CHOLECYSTECTOMY;  Surgeon: Rulon Abide, DO;  Location: [facility];  Service: General;  Laterality: N/A;   DILATATION & CURETTAGE/HYSTEROSCOPY WITH MYOSURE N/A 09/21/2021   Procedure: DILATATION & CURETTAGE/HYSTEROSCOPY WITH MYOSURE;  Surgeon: Jaymes Graff, MD;  Location: [facility];  Service: Gynecology;  Laterality: N/A;  rep will be here confirmed on 09/13/21 CS   KNEE SURGERY  08/2007, 10/2009   right knee- remove fibroma    reports that Misty Salazar has never smoked. Misty Salazar has never used smokeless tobacco. Misty Salazar reports current alcohol use. Misty Salazar reports that Misty Salazar does not use drugs. family history includes Cancer in her maternal grandmother and mother; Diabetes in her father and mother; Hyperlipidemia in her father and mother; Hypertension in her brother, father, and mother; Stroke in her maternal grandfather, maternal grandmother, and paternal grandfather. Allergies  Allergen Reactions    Oxycodone-Acetaminophen Itching and Hives   Percocet [Oxycodone-Acetaminophen] Hives and Itching    Review of Systems  Neurological:  Negative for focal weakness.      Objective:     BP (!) 140/80 (BP Location: Left Arm, Patient Position: Sitting, Cuff Size: Large)   Pulse 80   Temp 98.1 F (36.7 C) (Oral)   Ht 5\' 2"  (1.575 m)   Wt (!) 304 lb 9.6 oz (138.2 kg)   SpO2 95%   BMI 55.71 kg/m    Physical Exam Vitals reviewed.  Constitutional:      Appearance: Normal appearance.  Cardiovascular:     Rate and Rhythm: Normal rate and regular rhythm.  Pulmonary:     Effort: Pulmonary effort is normal.     Breath sounds: Normal breath sounds.  Musculoskeletal:     Comments: Right shoulder reveals full range of motion.  Misty Salazar does have some tenderness at the right acromioclavicular joint slightly worse with adduction.  No pain with abduction against resistance.  Minimal discomfort with internal rotation.  No biceps tenderness. No rotator cuff weakness  Neurological:     Mental Status: Misty Salazar is alert.      No results found for any visits on 09/12/22.    The 10-year ASCVD risk score (Arnett DK, et al., 2019) is: 5.9%    Assessment & Plan:   Problem List Items Addressed This Visit   None Visit Diagnoses     Strain of acromioclavicular joint, right, initial encounter    -  Primary     -  Recommend Misty Salazar avoid bowling over the next couple weeks -Try meloxicam 15 mg once daily and take with food -If no improvement in a couple weeks consider sports medicine referral  No follow-ups on file.    Evelena Peat, MD

## 2022-09-12 NOTE — Patient Instructions (Signed)
Try to back off the bowling for the next couple of weeks  Start the Meloxicam 15 mg daily.    Let me know if no improvement in couple of weeks.

## 2022-09-14 ENCOUNTER — Inpatient Hospital Stay: Payer: BC Managed Care – PPO | Attending: Hematology and Oncology

## 2022-09-14 ENCOUNTER — Other Ambulatory Visit: Payer: Self-pay

## 2022-09-14 VITALS — BP 121/61 | HR 81 | Temp 98.0°F | Resp 16

## 2022-09-14 DIAGNOSIS — Z862 Personal history of diseases of the blood and blood-forming organs and certain disorders involving the immune mechanism: Secondary | ICD-10-CM

## 2022-09-14 DIAGNOSIS — Z79899 Other long term (current) drug therapy: Secondary | ICD-10-CM | POA: Insufficient documentation

## 2022-09-14 DIAGNOSIS — D539 Nutritional anemia, unspecified: Secondary | ICD-10-CM

## 2022-09-14 DIAGNOSIS — D509 Iron deficiency anemia, unspecified: Secondary | ICD-10-CM | POA: Diagnosis not present

## 2022-09-14 MED ORDER — SODIUM CHLORIDE 0.9 % IV SOLN
510.0000 mg | Freq: Once | INTRAVENOUS | Status: AC
  Administered 2022-09-14: 510 mg via INTRAVENOUS
  Filled 2022-09-14: qty 510

## 2022-09-14 MED ORDER — SODIUM CHLORIDE 0.9 % IV SOLN: Freq: Once | INTRAVENOUS | Status: AC

## 2022-09-14 NOTE — Patient Instructions (Signed)
Ferumoxytol Injection What is this medication? FERUMOXYTOL (FER ue MOX i tol) treats low levels of iron in your body (iron deficiency anemia). Iron is a mineral that plays an important role in making red blood cells, which carry oxygen from your lungs to the rest of your body. This medicine may be used for other purposes; ask your health care provider or pharmacist if you have questions. COMMON BRAND NAME(S): Feraheme What should I tell my care team before I take this medication? They need to know if you have any of these conditions: Anemia not caused by low iron levels High levels of iron in the blood Magnetic resonance imaging (MRI) test scheduled An unusual or allergic reaction to iron, other medications, foods, dyes, or preservatives Pregnant or trying to get pregnant Breastfeeding How should I use this medication? This medication is injected into a vein. It is given by your care team in a hospital or clinic setting. Talk to your care team the use of this medication in children. Special care may be needed. Overdosage: If you think you have taken too much of this medicine contact a poison control center or emergency room at once. NOTE: This medicine is only for you. Do not share this medicine with others. What if I miss a dose? It is important not to miss your dose. Call your care team if you are unable to keep an appointment. What may interact with this medication? Other iron products This list may not describe all possible interactions. Give your health care provider a list of all the medicines, herbs, non-prescription drugs, or dietary supplements you use. Also tell them if you smoke, drink alcohol, or use illegal drugs. Some items may interact with your medicine. What should I watch for while using this medication? Visit your care team regularly. Tell your care team if your symptoms do not start to get better or if they get worse. You may need blood work done while you are taking this  medication. You may need to follow a special diet. Talk to your care team. Foods that contain iron include: whole grains/cereals, dried fruits, beans, or peas, leafy green vegetables, and organ meats (liver, kidney). What side effects may I notice from receiving this medication? Side effects that you should report to your care team as soon as possible: Allergic reactions--skin rash, itching, hives, swelling of the face, lips, tongue, or throat Low blood pressure--dizziness, feeling faint or lightheaded, blurry vision Shortness of breath Side effects that usually do not require medical attention (report to your care team if they continue or are bothersome): Flushing Headache Joint pain Muscle pain Nausea Pain, redness, or irritation at injection site This list may not describe all possible side effects. Call your doctor for medical advice about side effects. You may report side effects to FDA at 1-800-FDA-1088. Where should I keep my medication? This medication is given in a hospital or clinic. It will not be stored at home. NOTE: This sheet is a summary. It may not cover all possible information. If you have questions about this medicine, talk to your doctor, pharmacist, or health care provider.  2024 Elsevier/Gold Standard (2022-07-01 00:00:00)

## 2022-09-14 NOTE — Progress Notes (Signed)
Pt observed for 30 minutes post iron infusion. Tolerated well with no adverse s/s. VSS throughout. Pt ambulated independently to lobby for discharge. AVS reviewed.

## 2022-09-22 ENCOUNTER — Inpatient Hospital Stay: Payer: BC Managed Care – PPO

## 2022-09-22 ENCOUNTER — Other Ambulatory Visit: Payer: Self-pay

## 2022-09-22 VITALS — BP 126/70 | HR 75 | Temp 98.2°F | Resp 18

## 2022-09-22 DIAGNOSIS — Z862 Personal history of diseases of the blood and blood-forming organs and certain disorders involving the immune mechanism: Secondary | ICD-10-CM

## 2022-09-22 DIAGNOSIS — Z79899 Other long term (current) drug therapy: Secondary | ICD-10-CM | POA: Diagnosis not present

## 2022-09-22 DIAGNOSIS — D509 Iron deficiency anemia, unspecified: Secondary | ICD-10-CM | POA: Diagnosis not present

## 2022-09-22 DIAGNOSIS — D539 Nutritional anemia, unspecified: Secondary | ICD-10-CM

## 2022-09-22 MED ORDER — SODIUM CHLORIDE 0.9 % IV SOLN: Freq: Once | INTRAVENOUS | Status: AC

## 2022-09-22 MED ORDER — SODIUM CHLORIDE 0.9 % IV SOLN
510.0000 mg | Freq: Once | INTRAVENOUS | Status: AC
  Administered 2022-09-22: 510 mg via INTRAVENOUS
  Filled 2022-09-22: qty 510

## 2022-09-22 NOTE — Patient Instructions (Signed)
 Ferumoxytol Injection What is this medication? FERUMOXYTOL (FER ue MOX i tol) treats low levels of iron in your body (iron deficiency anemia). Iron is a mineral that plays an important role in making red blood cells, which carry oxygen from your lungs to the rest of your body. This medicine may be used for other purposes; ask your health care provider or pharmacist if you have questions. COMMON BRAND NAME(S): Feraheme What should I tell my care team before I take this medication? They need to know if you have any of these conditions: Anemia not caused by low iron levels High levels of iron in the blood Magnetic resonance imaging (MRI) test scheduled An unusual or allergic reaction to iron, other medications, foods, dyes, or preservatives Pregnant or trying to get pregnant Breastfeeding How should I use this medication? This medication is injected into a vein. It is given by your care team in a hospital or clinic setting. Talk to your care team the use of this medication in children. Special care may be needed. Overdosage: If you think you have taken too much of this medicine contact a poison control center or emergency room at once. NOTE: This medicine is only for you. Do not share this medicine with others. What if I miss a dose? It is important not to miss your dose. Call your care team if you are unable to keep an appointment. What may interact with this medication? Other iron products This list may not describe all possible interactions. Give your health care provider a list of all the medicines, herbs, non-prescription drugs, or dietary supplements you use. Also tell them if you smoke, drink alcohol, or use illegal drugs. Some items may interact with your medicine. What should I watch for while using this medication? Visit your care team regularly. Tell your care team if your symptoms do not start to get better or if they get worse. You may need blood work done while you are taking this  medication. You may need to follow a special diet. Talk to your care team. Foods that contain iron include: whole grains/cereals, dried fruits, beans, or peas, leafy green vegetables, and organ meats (liver, kidney). What side effects may I notice from receiving this medication? Side effects that you should report to your care team as soon as possible: Allergic reactions--skin rash, itching, hives, swelling of the face, lips, tongue, or throat Low blood pressure--dizziness, feeling faint or lightheaded, blurry vision Shortness of breath Side effects that usually do not require medical attention (report to your care team if they continue or are bothersome): Flushing Headache Joint pain Muscle pain Nausea Pain, redness, or irritation at injection site This list may not describe all possible side effects. Call your doctor for medical advice about side effects. You may report side effects to FDA at 1-800-FDA-1088. Where should I keep my medication? This medication is given in a hospital or clinic. It will not be stored at home. NOTE: This sheet is a summary. It may not cover all possible information. If you have questions about this medicine, talk to your doctor, pharmacist, or health care provider.  2024 Elsevier/Gold Standard (2022-07-01 00:00:00)

## 2022-09-22 NOTE — Progress Notes (Signed)
Patient waited 30 minutes post iron infusion with no issues. Vss upon discharge.

## 2022-12-12 DIAGNOSIS — E119 Type 2 diabetes mellitus without complications: Secondary | ICD-10-CM | POA: Diagnosis not present

## 2022-12-12 DIAGNOSIS — Z7985 Long-term (current) use of injectable non-insulin antidiabetic drugs: Secondary | ICD-10-CM | POA: Diagnosis not present

## 2022-12-12 DIAGNOSIS — I10 Essential (primary) hypertension: Secondary | ICD-10-CM | POA: Diagnosis not present

## 2022-12-16 ENCOUNTER — Inpatient Hospital Stay: Payer: BC Managed Care – PPO | Attending: Hematology and Oncology

## 2022-12-16 DIAGNOSIS — Z791 Long term (current) use of non-steroidal anti-inflammatories (NSAID): Secondary | ICD-10-CM | POA: Diagnosis not present

## 2022-12-16 DIAGNOSIS — D509 Iron deficiency anemia, unspecified: Secondary | ICD-10-CM | POA: Diagnosis not present

## 2022-12-16 DIAGNOSIS — N920 Excessive and frequent menstruation with regular cycle: Secondary | ICD-10-CM | POA: Insufficient documentation

## 2022-12-16 DIAGNOSIS — D539 Nutritional anemia, unspecified: Secondary | ICD-10-CM

## 2022-12-16 DIAGNOSIS — Z79899 Other long term (current) drug therapy: Secondary | ICD-10-CM | POA: Insufficient documentation

## 2022-12-16 LAB — CBC WITH DIFFERENTIAL/PLATELET
Abs Immature Granulocytes: 0.01 10*3/uL (ref 0.00–0.07)
Basophils Absolute: 0 10*3/uL (ref 0.0–0.1)
Basophils Relative: 1 %
Eosinophils Absolute: 0.1 10*3/uL (ref 0.0–0.5)
Eosinophils Relative: 2 %
HCT: 37.6 % (ref 36.0–46.0)
Hemoglobin: 12.7 g/dL (ref 12.0–15.0)
Immature Granulocytes: 0 %
Lymphocytes Relative: 33 %
Lymphs Abs: 1.7 10*3/uL (ref 0.7–4.0)
MCH: 27.8 pg (ref 26.0–34.0)
MCHC: 33.8 g/dL (ref 30.0–36.0)
MCV: 82.3 fL (ref 80.0–100.0)
Monocytes Absolute: 0.3 10*3/uL (ref 0.1–1.0)
Monocytes Relative: 5 %
Neutro Abs: 3.1 10*3/uL (ref 1.7–7.7)
Neutrophils Relative %: 59 %
Platelets: 302 10*3/uL (ref 150–400)
RBC: 4.57 MIL/uL (ref 3.87–5.11)
RDW: 14.1 % (ref 11.5–15.5)
WBC: 5.3 10*3/uL (ref 4.0–10.5)
nRBC: 0 % (ref 0.0–0.2)

## 2022-12-16 LAB — IRON AND IRON BINDING CAPACITY (CC-WL,HP ONLY)
Iron: 52 ug/dL (ref 28–170)
Saturation Ratios: 15 % (ref 10.4–31.8)
TIBC: 342 ug/dL (ref 250–450)
UIBC: 290 ug/dL (ref 148–442)

## 2022-12-16 LAB — FERRITIN: Ferritin: 124 ng/mL (ref 11–307)

## 2022-12-16 LAB — SEDIMENTATION RATE: Sed Rate: 35 mm/h — ABNORMAL HIGH (ref 0–22)

## 2022-12-19 LAB — HM DIABETES EYE EXAM

## 2022-12-22 ENCOUNTER — Inpatient Hospital Stay: Payer: BC Managed Care – PPO | Admitting: Hematology and Oncology

## 2022-12-22 ENCOUNTER — Encounter: Payer: Self-pay | Admitting: Hematology and Oncology

## 2022-12-22 DIAGNOSIS — Z862 Personal history of diseases of the blood and blood-forming organs and certain disorders involving the immune mechanism: Secondary | ICD-10-CM

## 2022-12-22 NOTE — Progress Notes (Signed)
HEMATOLOGY-ONCOLOGY ELECTRONIC VISIT PROGRESS NOTE  Patient Care Team: Kristian Covey, MD as PCP - General  I connected with the patient via telephone conference and verified that I am speaking with the correct person using two identifiers. The patient's location is at home and I am providing care from the [facility] I discussed the limitations, risks, security and privacy concerns of performing an evaluation and management service by e-visits and the availability of in person appointments.  I also discussed with the patient that there may be a patient responsible charge related to this service. The patient expressed understanding and agreed to proceed.   ASSESSMENT & PLAN:  IRON DEFICIENCY ANEMIA, HX OF Her recent blood work showed resolution of anemia Her iron stores are adequate She is currently not symptomatic The source of her bleeding in the past was menorrhagia but that has slowed down since myomectomy She does not need long-term follow-up with me unless she had recurrent iron deficiency anemia again She has an appointment pending to see her primary care doctor again March of next year and I recommend CBC with iron studies The patient know how to reach me if she needs iron treatment again in the future I addressed all her questions and concerns  No orders of the defined types were placed in this encounter.   INTERVAL HISTORY: Please see below for problem oriented charting. The purpose of today's discussion is to review test results She felt great after intravenous iron infusion She have no signs or symptoms of anemia We discussed test results  HISTORY OF PRESENTING ILLNESS:  Misty Salazar  is here because of anemia  She was found to have abnormal CBC from recent blood work I have the opportunity to review her blood work dated back to 2009 She had normal hemoglobin in 2020 with hemoglobin greater than 12 Most recently, she is anemic with microcytosis,  confirmed to be iron deficient with iron panel The patient has been anemic for many years  She denies recent chest pain on exertion, shortness of breath on minimal exertion, pre-syncopal episodes, or palpitations. She had not noticed any recent bleeding such as epistaxis, hematuria or hematochezia The patient takes occasional over the counter NSAID. She is not on antiplatelets agents. Her last colonoscopy was in 2003 when she presented with hematochezia but EGD and colonoscopy was normal She had no prior history or diagnosis of cancer. Her age appropriate screening programs are up-to-date. She denies any pica and eats a variety of diet. She never donated blood or received blood transfusion The patient was prescribed oral iron supplements and she takes daily with breakfast She had history of menorrhagia culminating to [facility] and myomectomy last year.  The patient bled every day between April to August Since her myomectomy, her.  Since less frequent and lighter In August 2024, she received 2 doses of intravenous iron Feraheme  REVIEW OF SYSTEMS:   Constitutional: Denies fevers, chills or abnormal weight loss Eyes: Denies blurriness of vision Ears, nose, mouth, throat, and face: Denies mucositis or sore throat Respiratory: Denies cough, dyspnea or wheezes Cardiovascular: Denies palpitation, chest discomfort Gastrointestinal:  Denies nausea, heartburn or change in bowel habits Skin: Denies abnormal skin rashes Lymphatics: Denies new lymphadenopathy or easy bruising Neurological:Denies numbness, tingling or new weaknesses Behavioral/Psych: Mood is stable, no new changes  Extremities: No lower extremity edema All other systems were reviewed with the patient and are negative.  I have reviewed the past medical history, past surgical history, social history  and family history with the patient and they are unchanged from previous note.  ALLERGIES:  is allergic to oxycodone-acetaminophen and  percocet [oxycodone-acetaminophen].  MEDICATIONS:  Current Outpatient Medications  Medication Sig Dispense Refill   amLODipine (NORVASC) 10 MG tablet Take 10 mg by mouth daily.     azelastine (ASTELIN) 0.1 % nasal spray Place 2 sprays into both nostrils 2 (two) times daily. Use in each nostril as directed 30 mL 5   cetirizine (ZYRTEC) 10 MG tablet Take 10 mg by mouth daily.     ferrous sulfate 325 (65 FE) MG tablet Take 325 mg by mouth daily with breakfast.     ketotifen (ALAWAY) 0.025 % ophthalmic solution Place 1 drop into both eyes daily as needed (allergies).     meloxicam (MOBIC) 15 MG tablet Take 1 tablet (15 mg total) by mouth daily. 30 tablet 0   Multiple Vitamin (MULTIVITAMIN) tablet Take 1 tablet by mouth daily.     OZEMPIC, 1 MG/DOSE, 4 MG/3ML SOPN Inject 1 mg into the skin once a week.     triamcinolone (NASACORT ALLERGY 24HR) 55 MCG/ACT AERO nasal inhaler Place 1 spray into the nose daily as needed (allergies).     No current facility-administered medications for this visit.    PHYSICAL EXAMINATION: ECOG PERFORMANCE STATUS: 0 - Asymptomatic  LABORATORY DATA:  I have reviewed the data as listed    Latest Ref Rng & Units 04/29/2022   11:06 AM 09/21/2021    7:01 AM 04/22/2020    9:38 AM  CMP  Glucose 70 - 99 mg/dL 85  696  295   BUN 6 - 23 mg/dL 8  7  7    Creatinine 0.40 - 1.20 mg/dL 2.84  1.32  4.40   Sodium 135 - 145 mEq/L 137  138  137   Potassium 3.5 - 5.1 mEq/L 4.1  3.3  4.1   Chloride 96 - 112 mEq/L 102  106  102   CO2 19 - 32 mEq/L 30  24  28    Calcium 8.4 - 10.5 mg/dL 9.6  8.8  9.6   Total Protein 6.0 - 8.3 g/dL 8.5   8.0   Total Bilirubin 0.2 - 1.2 mg/dL 0.5   0.6   Alkaline Phos 39 - 117 U/L 61   60   AST 0 - 37 U/L 14   11   ALT 0 - 35 U/L 14   14     Lab Results  Component Value Date   WBC 5.3 12/16/2022   HGB 12.7 12/16/2022   HCT 37.6 12/16/2022   MCV 82.3 12/16/2022   PLT 302 12/16/2022   NEUTROABS 3.1 12/16/2022    I discussed the  assessment and treatment plan with the patient. The patient was provided an opportunity to ask questions and all were answered. The patient agreed with the plan and demonstrated an understanding of the instructions. The patient was advised to call back or seek an in-person evaluation if the symptoms worsen or if the condition fails to improve as anticipated.    I spent 20 minutes for the appointment reviewing test results, discuss management and coordination of care.  Artis Delay, MD 12/22/2022 11:03 AM

## 2022-12-22 NOTE — Assessment & Plan Note (Signed)
Her recent blood work showed resolution of anemia Her iron stores are adequate She is currently not symptomatic The source of her bleeding in the past was menorrhagia but that has slowed down since myomectomy She does not need long-term follow-up with me unless she had recurrent iron deficiency anemia again She has an appointment pending to see her primary care doctor again March of next year and I recommend CBC with iron studies The patient know how to reach me if she needs iron treatment again in the future I addressed all her questions and concerns

## 2022-12-23 ENCOUNTER — Encounter: Payer: Self-pay | Admitting: Family Medicine

## 2023-01-04 ENCOUNTER — Ambulatory Visit (INDEPENDENT_AMBULATORY_CARE_PROVIDER_SITE_OTHER): Payer: BC Managed Care – PPO | Admitting: Family Medicine

## 2023-01-04 ENCOUNTER — Ambulatory Visit: Payer: BC Managed Care – PPO

## 2023-01-04 VITALS — BP 148/88 | HR 84 | Temp 98.1°F | Ht 62.0 in | Wt 304.8 lb

## 2023-01-04 DIAGNOSIS — M25511 Pain in right shoulder: Secondary | ICD-10-CM | POA: Diagnosis not present

## 2023-01-04 DIAGNOSIS — I10 Essential (primary) hypertension: Secondary | ICD-10-CM | POA: Diagnosis not present

## 2023-01-04 DIAGNOSIS — M19011 Primary osteoarthritis, right shoulder: Secondary | ICD-10-CM | POA: Diagnosis not present

## 2023-01-04 MED ORDER — CYCLOBENZAPRINE HCL 10 MG PO TABS: 10.0000 mg | ORAL_TABLET | Freq: Three times a day (TID) | ORAL | 0 refills | Status: DC | PRN

## 2023-01-04 NOTE — Progress Notes (Signed)
Established Patient Office Visit  Subjective   Patient ID: Misty Salazar, female    DOB: [birth year]  Age: 42 y.o. MRN: [id number]  Chief Complaint  Patient presents with   Shoulder Pain    Patient complains of right shoulder pain, Tried Motrin     HPI   [name] is seen today with some ongoing right shoulder pain.  Refer to prior note for details.  First noticed after carrying some heavy bowling ball bags.  Denied any specific injury.  Pain seem to be confined mostly AC joint region.  She tried some meloxicam but had slight improvement but still persistent pain since August.  She is tried some heat and Tylenol with mild relief.  No cervical radiculitis symptoms.  Still bowling regularly without difficulty.  Pain seems to be mostly with adduction.  Still working out the gym and no problem with elliptical  She has hypertension treated with amlodipine 10 mg daily and apparently on losartan 25 mg daily per endocrinologist.  Compliant with medications.  Blood pressure up today which is somewhat atypical for her.  She did eat some [place] bacon yesterday  Past Medical History:  Diagnosis Date   Allergy    Anemia    COVID-19 2022   Diabetes mellitus without complication (HCC)    Hypertension    Hyperthyroidism    Thyroid disease    hyperthyroidism   Past Surgical History:  Procedure Laterality Date   CHOLECYSTECTOMY  01/20/2011   Procedure: LAPAROSCOPIC CHOLECYSTECTOMY;  Surgeon: Rulon Abide, DO;  Location: [facility];  Service: General;  Laterality: N/A;   DILATATION & CURETTAGE/HYSTEROSCOPY WITH MYOSURE N/A 09/21/2021   Procedure: DILATATION & CURETTAGE/HYSTEROSCOPY WITH MYOSURE;  Surgeon: Jaymes Graff, MD;  Location: [facility];  Service: Gynecology;  Laterality: N/A;  rep will be here confirmed on 09/13/21 CS   KNEE SURGERY  08/2007, 10/2009   right knee- remove fibroma    reports that she has never smoked. She has never used smokeless tobacco. She reports current alcohol use.  She reports that she does not use drugs. family history includes Cancer in her maternal grandmother and mother; Diabetes in her father and mother; Hyperlipidemia in her father and mother; Hypertension in her brother, father, and mother; Stroke in her maternal grandfather, maternal grandmother, and paternal grandfather. Allergies  Allergen Reactions   Oxycodone-Acetaminophen Itching and Hives   Percocet [Oxycodone-Acetaminophen] Hives and Itching    ROS    Objective:     BP (!) 148/88 (BP Location: Left Arm, Cuff Size: Large)   Pulse 84   Temp 98.1 F (36.7 C) (Oral)   Ht 5\' 2"  (1.575 m)   Wt (!) 304 lb 12.8 oz (138.3 kg)   SpO2 98%   BMI 55.75 kg/m    Physical Exam Constitutional:      Appearance: She is well-developed.  Eyes:     Pupils: Pupils are equal, round, and reactive to light.  Neck:     Thyroid: No thyromegaly.     Vascular: No JVD.  Cardiovascular:     Rate and Rhythm: Normal rate and regular rhythm.     Heart sounds:     No gallop.  Pulmonary:     Effort: Pulmonary effort is normal. No respiratory distress.     Breath sounds: Normal breath sounds. No wheezing or rales.  Musculoskeletal:     Cervical back: Neck supple.     Comments: Right shoulder reveals some mild trapezius tenderness on the right side.  Still has  some mild right acromioclavicular tenderness.  No other areas of point tenderness.  Full range of motion right shoulder.  Does have some mild pain with adduction of right shoulder.  No pain with abduction or internal rotation.  No evidence for rotator cuff weakness  Does have some palpable muscle tension right trapezius with tenderness mid aspect of the trapezius muscle  Neurological:     Mental Status: She is alert.      No results found for any visits on 01/04/23.    The 10-year ASCVD risk score (Arnett DK, et al., 2019) is: 8.3%    Assessment & Plan:   Problem List Items Addressed This Visit   None Visit Diagnoses     Acute pain  of right shoulder    -  Primary   Relevant Orders   DG Shoulder Right     Several month history of ongoing right shoulder pain.  No evidence of rotator cuff tear.  Doubt rotator cuff tendinitis.  Seems to have some ongoing acromioclavicular tenderness but also some trapezius muscle tenderness and possible spasm.  -Obtain x-ray right shoulder -Consider trial of Flexeril 10 mg nightly.  She is aware this may be sedating -Continue regular activities with exercise and bowling as tolerated -Continue conservative muscle treatment for right trapezius with heat and topical sports creams and massage. -If not improving over the next couple of weeks recommend sports medicine referral  Blood pressure is up today but this is atypical for her.  She will monitor closely next couple weeks and be in touch if consistent readings over 140/90  No follow-ups on file.    Evelena Peat, MD

## 2023-01-09 ENCOUNTER — Ambulatory Visit: Payer: BC Managed Care – PPO | Admitting: Family Medicine

## 2023-01-13 ENCOUNTER — Other Ambulatory Visit: Payer: Self-pay | Admitting: Family Medicine

## 2023-01-31 ENCOUNTER — Encounter: Payer: Self-pay | Admitting: Family Medicine

## 2023-01-31 NOTE — Telephone Encounter (Signed)
 Care team updated and letter sent for eye exam notes.

## 2023-02-02 ENCOUNTER — Encounter: Payer: Self-pay | Admitting: Family Medicine

## 2023-02-14 ENCOUNTER — Other Ambulatory Visit: Payer: Self-pay | Admitting: Family Medicine

## 2023-03-01 NOTE — Telephone Encounter (Signed)
Noted  

## 2023-03-01 NOTE — Telephone Encounter (Signed)
Referral was placed in wrong WQ. The referral is now sent to [facility].

## 2023-03-08 ENCOUNTER — Ambulatory Visit (INDEPENDENT_AMBULATORY_CARE_PROVIDER_SITE_OTHER): Payer: BC Managed Care – PPO | Admitting: Family Medicine

## 2023-03-08 ENCOUNTER — Encounter: Payer: Self-pay | Admitting: Family Medicine

## 2023-03-08 VITALS — BP 122/82 | Ht 62.0 in | Wt 299.0 lb

## 2023-03-08 DIAGNOSIS — M67911 Unspecified disorder of synovium and tendon, right shoulder: Secondary | ICD-10-CM

## 2023-03-08 DIAGNOSIS — M19011 Primary osteoarthritis, right shoulder: Secondary | ICD-10-CM

## 2023-03-08 MED ORDER — DICLOFENAC SODIUM 75 MG PO TBEC
75.0000 mg | DELAYED_RELEASE_TABLET | Freq: Two times a day (BID) | ORAL | 0 refills | Status: AC
Start: 2023-03-08 — End: 2023-04-07

## 2023-03-08 NOTE — Progress Notes (Signed)
CHIEF COMPLAINT: No chief complaint on file.  _____________________________________________________________ SUBJECTIVE  HPI  Pt is a 43 y.o. female here for evaluation of Acute right shoulder pain  Ongoing for close to 6 months Inciting event: see below Primarily located: ACJ, pain with cross arm motions Radiating: none Numbness/tingling: none Catching/locking: none Exacerbated by  Back in July was so painful she couldn't even clap in church w/o pain  Anterior shoulder pain with shoulder extension  Cross arm motions Therapies tried so far:  Meloxicam (improved but didn't do much)  Voltaren topical with some benefit  Sport: enjoys bowling, has not been able to do many games as pain with cumulative   Seen by PCP 01/04/2023 for right shoulder pain.  Prior to this had also been seen 09/12/2022 -Insidious onset -Suspected exacerbated by amateur bowling tournament in late July 2024 after carrying heavy bags through the airport and bowling multiple games; soreness noted in the shoulder, pain was worse with adduction around the [place] joint.  Without cervical radiculitis symptoms.  09/12/2022 visit had been prescribed meloxicam, advised activity modification -01/04/2023 visit; slight improvement but with persistent pain.  Therapies have included heat, Tylenol offering mild relief.  Still without cervical radiculitis symptoms.  Pain with abduction mostly, no issues while working out at the gym/with elliptical. -Exam at that time revealed mild trapezius tenderness (right), acromioclavicular tenderness, full range of motion, pain with abduction, no pain with abduction or internal rotation -Right shoulder x-ray was obtained, started on Flexeril, activity modification as tolerated, heat, topicals, referred to sports medicine  Right shoulder x-ray independently reviewed, AC joint narrowing with degenerative changes noted on internal rotation AP view, axillary and scapular Y  unremarkable   ------------------------------------------------------------------------------------------------------ Past Medical History:  Diagnosis Date   Allergy    Anemia    COVID-19 2022   Diabetes mellitus without complication (HCC)    Hypertension    Hyperthyroidism    Thyroid disease    hyperthyroidism    Past Surgical History:  Procedure Laterality Date   CHOLECYSTECTOMY  01/20/2011   Procedure: LAPAROSCOPIC CHOLECYSTECTOMY;  Surgeon: Rulon Abide, DO;  Location: [facility];  Service: General;  Laterality: N/A;   DILATATION & CURETTAGE/HYSTEROSCOPY WITH MYOSURE N/A 09/21/2021   Procedure: DILATATION & CURETTAGE/HYSTEROSCOPY WITH MYOSURE;  Surgeon: Jaymes Graff, MD;  Location: [facility];  Service: Gynecology;  Laterality: N/A;  rep will be here confirmed on 09/13/21 CS   KNEE SURGERY  08/2007, 10/2009   right knee- remove fibroma      Outpatient Encounter Medications as of 03/08/2023  Medication Sig   diclofenac (VOLTAREN) 75 MG EC tablet Take 1 tablet (75 mg total) by mouth 2 (two) times daily.   amLODipine (NORVASC) 10 MG tablet Take 10 mg by mouth daily.   Azelastine HCl 137 MCG/SPRAY SOLN PLACE 2 SPRAYS INTO BOTH NOSTRILS 2 (TWO) TIMES DAILY. USE IN EACH NOSTRIL AS DIRECTED   cetirizine (ZYRTEC) 10 MG tablet Take 10 mg by mouth daily.   cyclobenzaprine (FLEXERIL) 10 MG tablet Take 1 tablet (10 mg total) by mouth 3 (three) times daily as needed for muscle spasms.   ketotifen (ALAWAY) 0.025 % ophthalmic solution Place 1 drop into both eyes daily as needed (allergies).   losartan (COZAAR) 25 MG tablet Take 25 mg by mouth daily.   Multiple Vitamin (MULTIVITAMIN) tablet Take 1 tablet by mouth daily.   OZEMPIC, 1 MG/DOSE, 4 MG/3ML SOPN Inject 1 mg into the skin once a week.   No facility-administered encounter medications on file as of  03/08/2023.     ------------------------------------------------------------------------------------------------------  _____________________________________________________________ OBJECTIVE  PHYSICAL EXAM  Today's Vitals   03/08/23 0826  BP: 122/82  Weight: 299 lb (135.6 kg)  Height: 5\' 2"  (1.575 m)   Body mass index is 54.69 kg/m.   reviewed  General: A+Ox3, no acute distress, well-nourished, appropriate affect CV: pulses 2+ regular, nondiaphoretic, no peripheral edema, cap refill <2sec Lungs: no audible wheezing, non-labored breathing, bilateral chest rise/fall, nontachypneic Skin: warm, well-perfused, non-icteric, no susp lesions or rashes Neuro:  Sensation intact, muscle tone wnl, no atrophy Psych: no signs of depression or anxiety MSK:  R Shoulder:  No deformity, swelling or muscle wasting FF 180, abd 180, reports pressure but no pain with end ROM. Shoulder extension L arm to T10, R arm to L3 TTP ACJ, lateral humerus, some R proximal trapezius hypertonicity TTP (separate from shoulder pain) NTTP over the [place], clavicle, coracoid, biceps groove, subacromial space, scap spine, cervical spine Neg neer provocative of ACJ pain, hawkins neg, empty can + weakness, scarf test +, hornblower +weakness, resisted anterior flexion neg, subscap liftoff neg, speeds neg, obriens neg, yergason neg, strength intact IR/ER FROM of neck  Limited POCUS demonstrating ACJ cortical irregularity, geyser sign _____________________________________________________________ ASSESSMENT/PLAN Diagnoses and all orders for this visit:  Arthritis of right acromioclavicular joint Rotator cuff dysfunction, right -     Ambulatory referral to Physical Therapy -     diclofenac (VOLTAREN) 75 MG EC tablet; Take 1 tablet (75 mg total) by mouth 2 (two) times daily.  Discussed options for management. Interested in trialing alternate NSAID for pain management, trial PO voltaren BID, counseled on avoiding other NSAIDs while  using this medication. May take tylenol. Discussed topical otc, heat therapy, gentle ROM. Activity modification as tolerated with respect to bowling. Rx PT for RTC strengthening. Discussed that if pain refractory to interventions or worsens, appropriate to return for ACJ CSI/SAB CSI as appropriate. All questions answered. Return precautions discussed. Patient verbalized understanding and is in agreement with plan. Anticipate f/u 6-8 weeks PRN, pt to call  Electronically signed by: Burna Forts, MD 03/08/2023 9:16 AM

## 2023-03-21 ENCOUNTER — Other Ambulatory Visit: Payer: Self-pay

## 2023-03-21 ENCOUNTER — Ambulatory Visit: Payer: BC Managed Care – PPO | Attending: Family Medicine

## 2023-03-21 DIAGNOSIS — M67911 Unspecified disorder of synovium and tendon, right shoulder: Secondary | ICD-10-CM | POA: Insufficient documentation

## 2023-03-21 DIAGNOSIS — M25611 Stiffness of right shoulder, not elsewhere classified: Secondary | ICD-10-CM | POA: Diagnosis not present

## 2023-03-21 DIAGNOSIS — M19011 Primary osteoarthritis, right shoulder: Secondary | ICD-10-CM | POA: Insufficient documentation

## 2023-03-21 DIAGNOSIS — M25511 Pain in right shoulder: Secondary | ICD-10-CM | POA: Insufficient documentation

## 2023-03-21 DIAGNOSIS — G8929 Other chronic pain: Secondary | ICD-10-CM | POA: Insufficient documentation

## 2023-03-21 IMAGING — RF DG HYSTEROGRAM
9 series · 14 of 24 positions shown · IV contrast (omnipaque)
Comparison: None.

CLINICAL DATA: Primary female infertility.

EXAM:
HYSTEROSALPINGOGRAM
TECHNIQUE: Following cleansing of the cervix and vagina with Betadine solution,
a hysterosalpingogram was performed using a 5-French
hysterosalpingogram catheter and Omnipaque 300 contrast. The patient
tolerated the examination without difficulty.

[Series 1: one shot · 1 of 1 slices shown (1 of 2)]
[im 1/1]
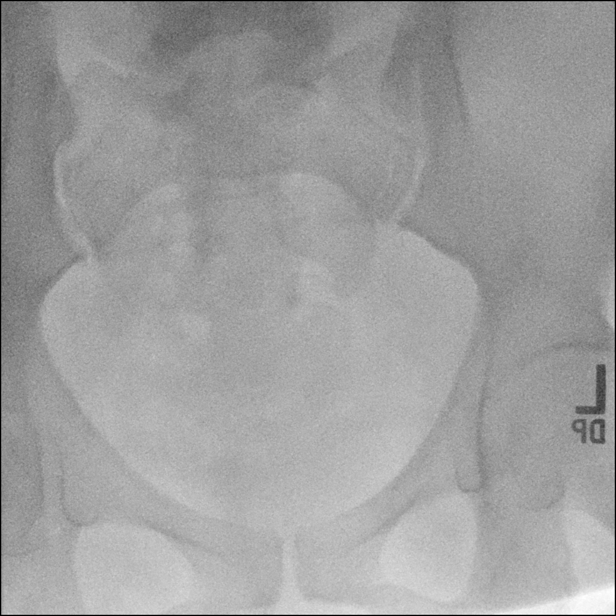

[Series 2: sequence · 1 of 66 frames shown (1 of 7)]
[frame 48/66]
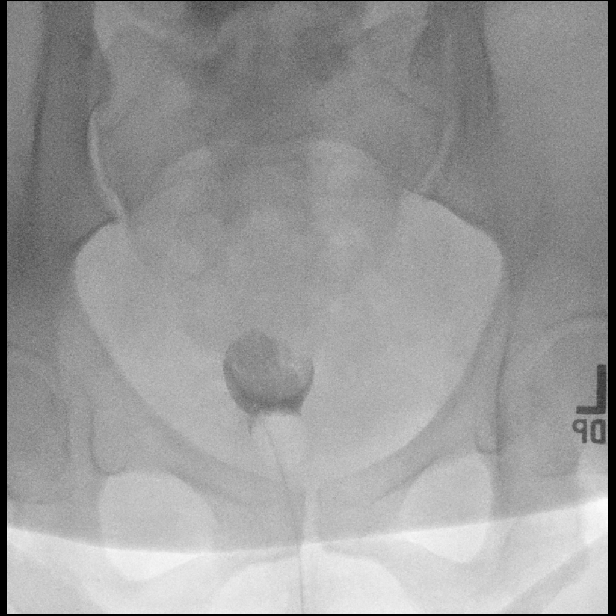

[Series 4: sequence · 2 of 32 frames shown (2 of 7)]
[frame 5/32]
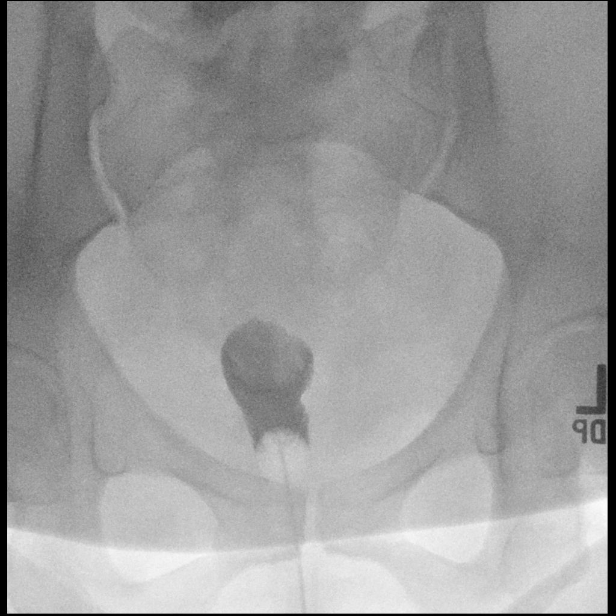
[frame 28/32]
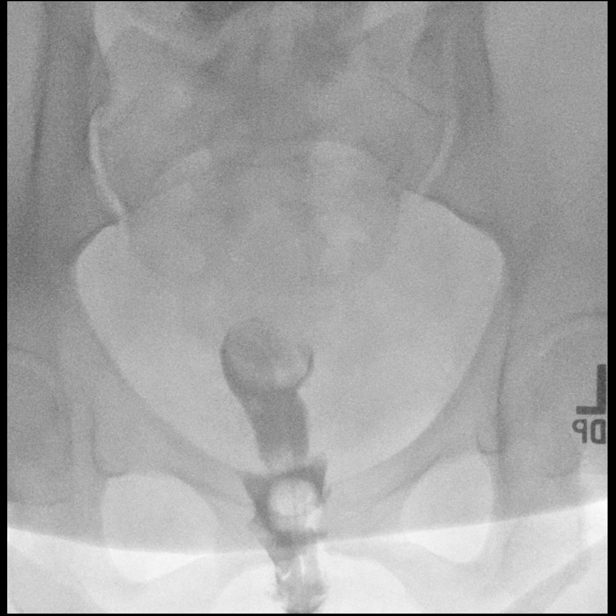

[Series 5: sequence · 2 of 10 frames shown (3 of 7)]
[frame 2/10]
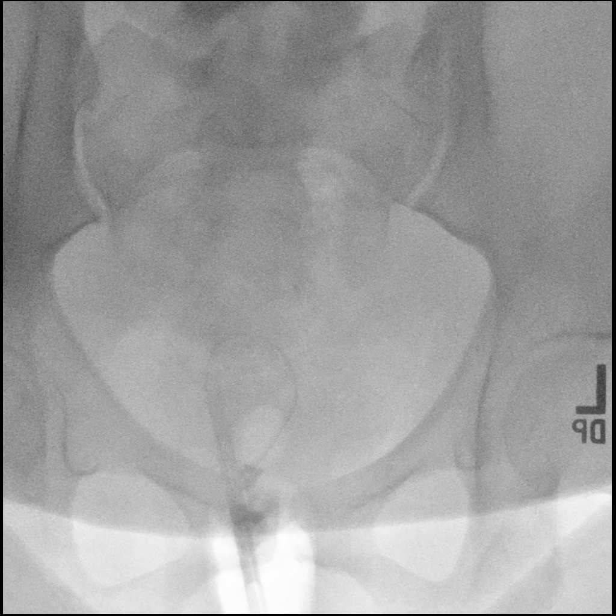
[frame 10/10]
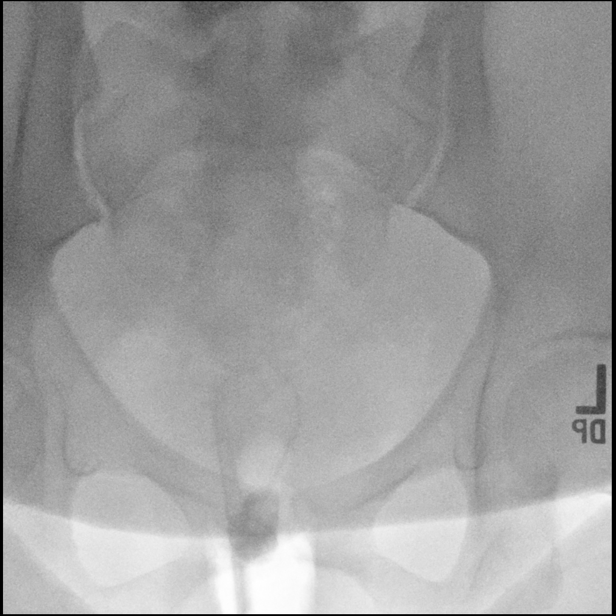

[Series 6: sequence · 1 of 31 frames shown (4 of 7)]
[frame 16/31]
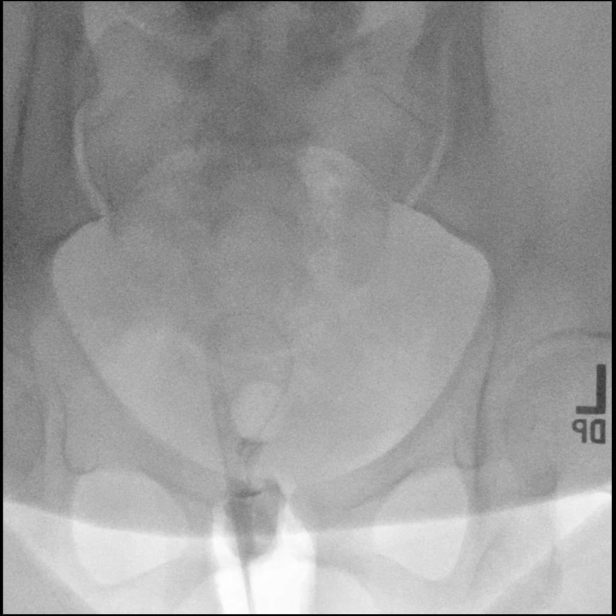

[Series 7: sequence · 2 of 100 frames shown (5 of 7)]
[frame 1/100]
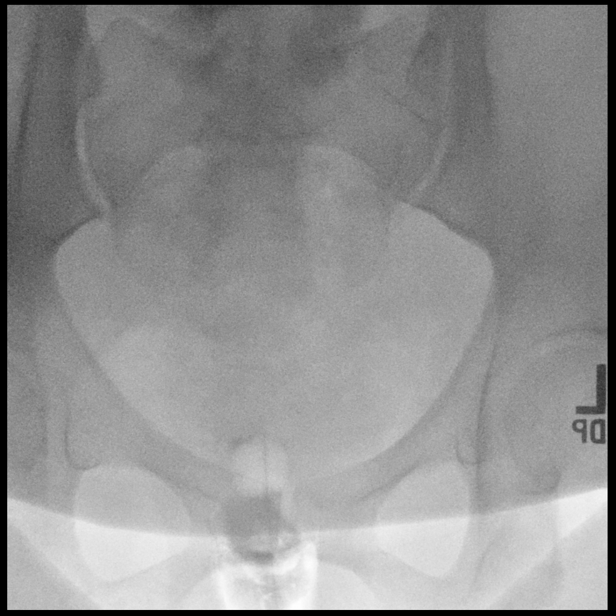
[frame 86/100]
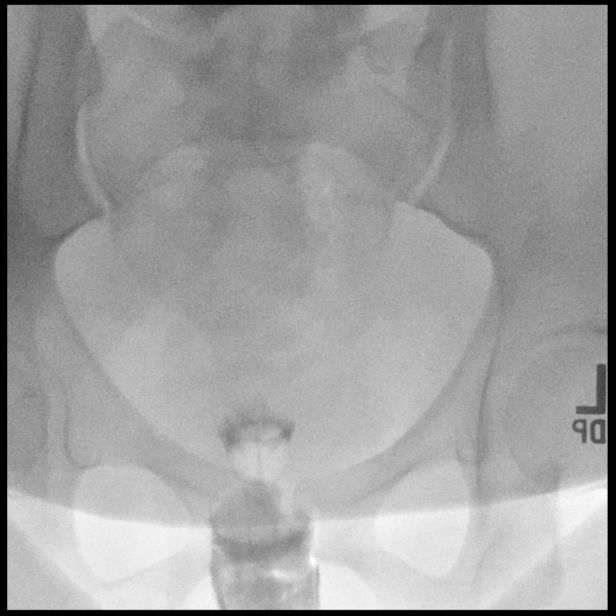

[Series 8: sequence · 1 of 30 frames shown (6 of 7)]
[frame 16/30]
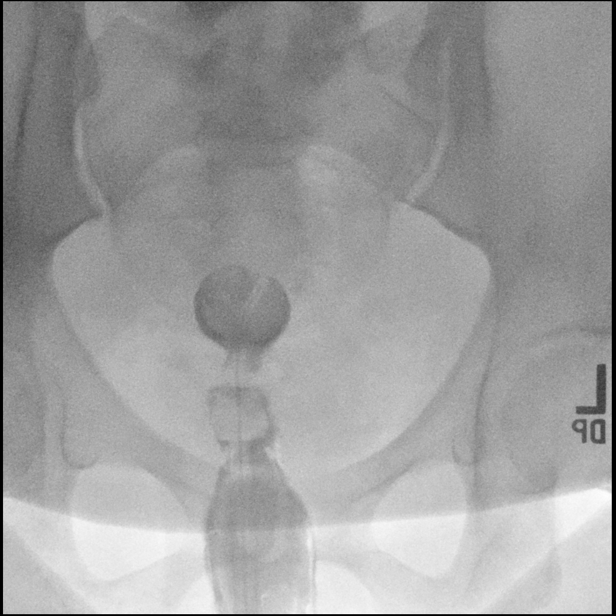

[Series 10: sequence · 2 of 80 frames shown (7 of 7)]
[frame 6/80]
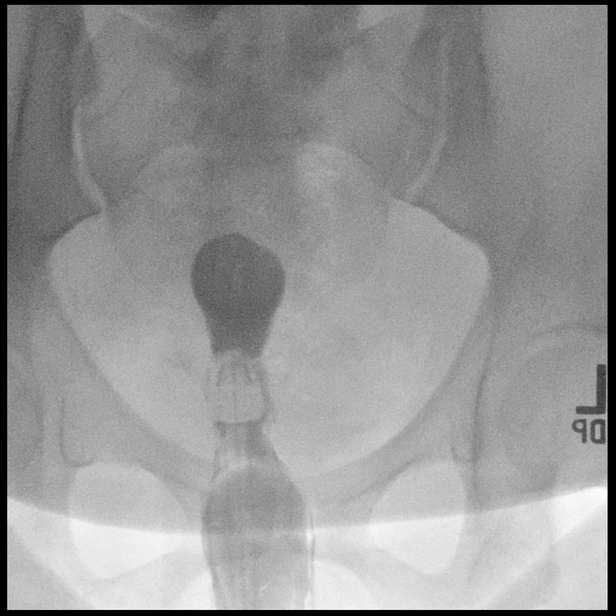
[frame 13/80]
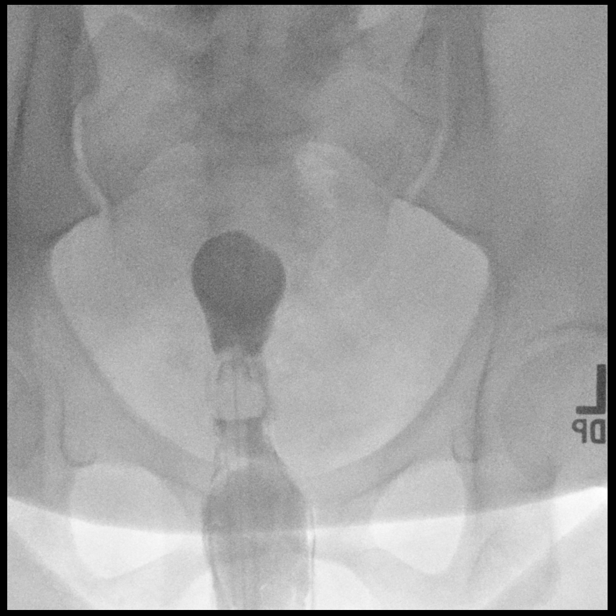

[Series 11: one shot · 0.15mm/px · 2 of 4 slices shown (2 of 2)]
[im 1/4]
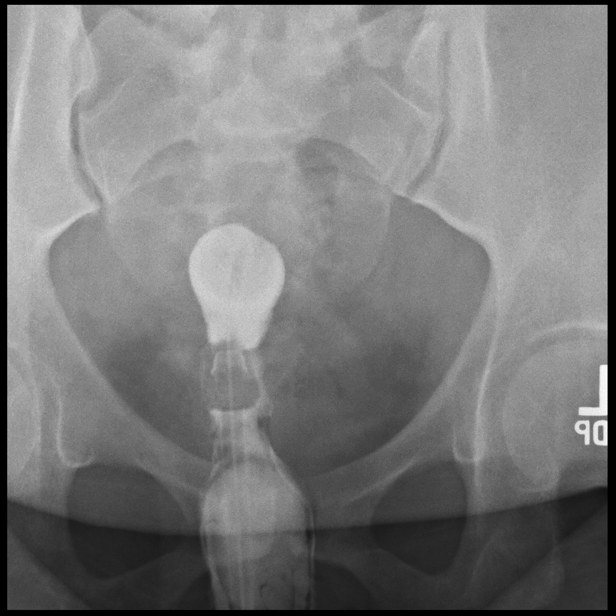
[im 4/4]
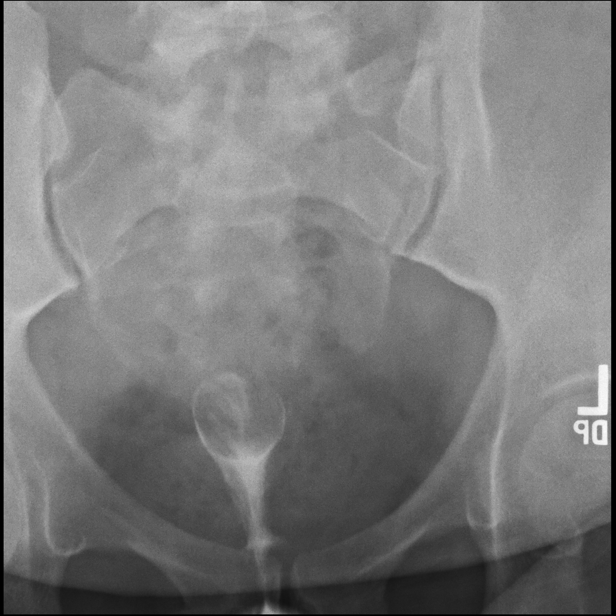

[14 of 24 positions shown; findings below may reference images not displayed]

FLUOROSCOPY:
Radiation Exposure Index (as provided by the fluoroscopic device):
140 mGy Kerma
FINDINGS: Persistent large round filling defect in the fundal uterine cavity
filling much of the uterine cavity. No evidence of contrast filling
either fallopian tube despite multiple attempts with both balloon
and acorn catheters with steady pressure applied.
IMPRESSION: 1. Persistent large round filling defect in the fundal uterine
cavity, filling much of the uterine cavity. Finding could represent
an intracavitary/submucosal fibroid, large endometrial polyp or less
likely blood products (patient with spotting throughout procedure).
Recommend further evaluation with pelvic ultrasound at this time.
MRI pelvis without and with IV contrast could also be considered, as
clinically warranted.
2. No evidence of contrast filling either Fallopian tube despite
multiple attempts with both balloon and acorn catheters with steady
pressure applied. Findings are compatible with bilateral tubal
occlusion.

## 2023-03-21 NOTE — Therapy (Signed)
 OUTPATIENT PHYSICAL THERAPY SHOULDER EVALUATION   Patient Name: Misty Salazar MRN: [id number] DOB:1980-04-30, 43 y.o., female Today's Date: 03/21/2023  END OF SESSION:  PT End of Session - 03/21/23 1547     Visit Number 1    Date for PT Re-Evaluation 05/16/23    Progress Note Due on Visit 10    PT Start Time 1015    PT Stop Time 1100    PT Time Calculation (min) 45 min    Activity Tolerance Patient tolerated treatment well    Behavior During Therapy [facility] for tasks assessed/performed             Past Medical History:  Diagnosis Date   Allergy    Anemia    COVID-19 2022   Diabetes mellitus without complication (HCC)    Hypertension    Hyperthyroidism    Thyroid disease    hyperthyroidism   Past Surgical History:  Procedure Laterality Date   CHOLECYSTECTOMY  01/20/2011   Procedure: LAPAROSCOPIC CHOLECYSTECTOMY;  Surgeon: Rulon Abide, DO;  Location: [facility];  Service: General;  Laterality: N/A;   DILATATION & CURETTAGE/HYSTEROSCOPY WITH MYOSURE N/A 09/21/2021   Procedure: DILATATION & CURETTAGE/HYSTEROSCOPY WITH MYOSURE;  Surgeon: Jaymes Graff, MD;  Location: [facility];  Service: Gynecology;  Laterality: N/A;  rep will be here confirmed on 09/13/21 CS   KNEE SURGERY  08/2007, 10/2009   right knee- remove fibroma   Patient Active Problem List   Diagnosis Date Noted   Essential hypertension 01/04/2023   Deficiency anemia 05/26/2022   History of uterine fibroid 04/29/2022   Obesity, Class III, BMI 40-49.9 (morbid obesity) (HCC) 09/04/2017   Type 2 diabetes mellitus without complications (HCC) 04/12/2016   MONONUCLEOSIS 05/21/2009   IRON DEFICIENCY ANEMIA, HX OF 05/21/2009    PCP: Evelena Peat, MD  REFERRING PROVIDER: Rich Brave, MD  REFERRING DIAG: Chronic R shoulder pain, ACJ arthritis on XR and POCUS, weakness of RTC; evaluate/treat with hopes of being able to bowl again, may benefit from HEP   THERAPY DIAG:  Stiffness of right shoulder, not  elsewhere classified  Dysfunction of right rotator cuff  Chronic right shoulder pain  Rationale for Evaluation and Treatment: Rehabilitation  ONSET DATE: July 2024  SUBJECTIVE:                                                                                                                                                                                      SUBJECTIVE STATEMENT: Originally injured R shoulder, when I travelled on plane for bowling tournament and pulled my bag through the airport with 3 bowling balls in it, since that time have had pain R shoulder.  Hand dominance: Right  PERTINENT HISTORY: R shoulder injury, has tried various types of medications, saw sports med MD and was referred to PT  PAIN:  Are you having pain? Yes: NPRS scale: 2 to6 Pain location: R superior /ant shoulder Pain description: ache, deep Aggravating factors: sleeping on that side, bowling, overuse Relieving factors: meds help  PRECAUTIONS: None  RED FLAGS: None   WEIGHT BEARING RESTRICTIONS: No  FALLS:  Has patient fallen in last 6 months? 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  DIAGNOSTIC FINDINGS:  OA R AC jt  PATIENT SURVEYS:  Quick Dash QuickDASH Score: 25.0 / 100 = 25.0 %  COGNITION: Overall cognitive status: Within functional limits for tasks assessed     SENSATION: WFL  POSTURE: R shoulder protracted, humeral head sitting forward in glenoid  UPPER EXTREMITY ROM:   Active ROM Right eval Left eval  Shoulder flexion 130 160  Shoulder extension    Shoulder abduction     Shoulder adduction    Shoulder internal rotation    Shoulder external rotation    Elbow flexion    Elbow extension    Wrist flexion    Wrist extension    Wrist ulnar deviation    Wrist radial deviation    Wrist pronation    Wrist supination    (Blank rows = not tested)  UPPER EXTREMITY MMT:  MMT Right eval Left eval  Shoulder flexion 4 wfl  Shoulder extension 4-   Shoulder abduction    Shoulder adduction    Shoulder internal rotation 4 wfl  Shoulder external rotation 4+ 4  Middle trapezius 3+ 4-  Lower trapezius    Elbow flexion    Elbow extension    Wrist flexion    Wrist extension    Wrist ulnar deviation    Wrist radial deviation    Wrist pronation    Wrist supination    Grip strength (lbs)    (Blank rows = not tested)  SHOULDER SPECIAL TESTS: Impingement tests: Neer impingement test: positive  and Hawkins/Kennedy impingement test: positive   Rotator cuff assessment: External rotation lag sign: negative and Belly press test: negative   JOINT MOBILITY TESTING:  Normal mobility R GH jt for AP glides, PA glides, Inf glides  PALPATION:  Quite Tender over long and short head of biceps,mild over R supraspinatus, very tender subscapularis                                                                                                                             TREATMENT DATE: 03/21/23 Evaluation, education regarding anatomy, mechanics  of shoulder.  Kinesiotaping R shoulder 2 I pieces to achieve support of R biceps long head as well as R supraspinatus Instructed in therex to engage R middle traps and R shoulder extensors, advised to perform high reps and daily exercise as these muscles are endurance muscles.   PATIENT EDUCATION: Education details: POC, goals Person educated: Patient Education method: Programmer, multimedia, Demonstration, Tactile cues, Verbal cues, and Handouts Education comprehension: verbalized understanding, returned demonstration, and verbal cues  required  HOME EXERCISE PROGRAM: Inst in prone rows with ER Prone shoulder extension, provided with printed copies from med bridge  ASSESSMENT:  CLINICAL IMPRESSION: Patient is a 43 y.o. female who was evaluated today by physical therapy due to R shoulder injury which occurred 8 months ago when carrying her bowling balls in her bag through the airport.  She does present with limitations in flexion ROM R shoulder and with irritable tissue R biceps as well as R subscapularis.  She also demonstrates weakness middle traps and R shoulder extenders, also some IR.  She responded well to the therex today.  She wishes to space out her PT visits due to the amt of her deductible and cost of visits.  We scheduled her again in 2 weeks. She is a good candidate for skilled PT to address her deficits, goals, improve her tolerance for ADL's and ability to return to bowling.  OBJECTIVE IMPAIRMENTS: decreased ROM, decreased strength, hypomobility, impaired flexibility, impaired UE functional use, and pain.   ACTIVITY LIMITATIONS: carrying, lifting, sleeping, and reach over head  PARTICIPATION LIMITATIONS: cleaning, laundry, and community activity  PERSONAL FACTORS: Behavior pattern, Fitness, Past/current experiences, Time since onset of injury/illness/exacerbation, and 1-2 comorbidities: DM, HTN, financial restrictions  are also affecting patient's functional outcome.   REHAB POTENTIAL: Good  CLINICAL DECISION MAKING: Evolving/moderate complexity  EVALUATION COMPLEXITY: Moderate   GOALS: Goals reviewed with patient? Yes  SHORT TERM GOALS: Target date: 2 weeks 04/04/23  I HEP Baseline:initiated at eval Goal status: INITIAL    LONG TERM GOALS: Target date: 05/16/23  Strength R shoulder for IR improve to 5/5 from 4/5, improve B middle traps strength to 4+/5 from 3+/5 Baseline:  Goal status: INITIAL  2.  Improve QuickDASH Score: 25.0 / 100 = 25.0 % to  15% or less disability Baseline:  Goal  status: INITIAL  3.   R shoulder flexion AROM 145 or greater Baseline: 130 Goal status: INITIAL  4.  Able to bowl competitively without lingering pain R shoulder Baseline:  Goal status: INITIAL   PLAN:  PT FREQUENCY: 2x/month  PT DURATION: 8 weeks  PLANNED INTERVENTIONS: 97110-Therapeutic exercises, 97530- Therapeutic activity, 97112- Neuromuscular re-education, 97535- Self Care, and 16109- Manual therapy  PLAN FOR NEXT SESSION: review and retest Rom and strength R shoulder, advance therex as needed, manual techniques including jt mobs and possible TPDN.   Ilyanna Baillargeon L Josejuan Hoaglin, PT, DPT, OCS 03/21/2023, 3:54 PM

## 2023-03-31 ENCOUNTER — Other Ambulatory Visit: Payer: Self-pay | Admitting: Family Medicine

## 2023-03-31 DIAGNOSIS — Z124 Encounter for screening for malignant neoplasm of cervix: Secondary | ICD-10-CM | POA: Diagnosis not present

## 2023-03-31 DIAGNOSIS — Z6841 Body Mass Index (BMI) 40.0 and over, adult: Secondary | ICD-10-CM | POA: Diagnosis not present

## 2023-03-31 DIAGNOSIS — Z1231 Encounter for screening mammogram for malignant neoplasm of breast: Secondary | ICD-10-CM | POA: Diagnosis not present

## 2023-03-31 DIAGNOSIS — Z01419 Encounter for gynecological examination (general) (routine) without abnormal findings: Secondary | ICD-10-CM | POA: Diagnosis not present

## 2023-03-31 DIAGNOSIS — Z01411 Encounter for gynecological examination (general) (routine) with abnormal findings: Secondary | ICD-10-CM | POA: Diagnosis not present

## 2023-04-04 ENCOUNTER — Ambulatory Visit: Payer: BC Managed Care – PPO

## 2023-04-04 ENCOUNTER — Other Ambulatory Visit: Payer: Self-pay

## 2023-04-04 DIAGNOSIS — M25611 Stiffness of right shoulder, not elsewhere classified: Secondary | ICD-10-CM | POA: Diagnosis not present

## 2023-04-04 DIAGNOSIS — M67911 Unspecified disorder of synovium and tendon, right shoulder: Secondary | ICD-10-CM | POA: Diagnosis not present

## 2023-04-04 DIAGNOSIS — G8929 Other chronic pain: Secondary | ICD-10-CM

## 2023-04-04 DIAGNOSIS — M25511 Pain in right shoulder: Secondary | ICD-10-CM | POA: Diagnosis not present

## 2023-04-04 DIAGNOSIS — M19011 Primary osteoarthritis, right shoulder: Secondary | ICD-10-CM | POA: Diagnosis not present

## 2023-04-04 NOTE — Therapy (Signed)
 OUTPATIENT PHYSICAL THERAPY SHOULDER TREATMENT   Patient Name: Misty Salazar MRN: [id number] DOB:[birth year], 43 y.o., female Today's Date: 04/04/2023  END OF SESSION:  PT End of Session - 04/04/23 1547     Visit Number 2    Date for PT Re-Evaluation 05/16/23    Progress Note Due on Visit 10    PT Start Time 0806    PT Stop Time 0846    PT Time Calculation (min) 40 min    Activity Tolerance Patient tolerated treatment well    Behavior During Therapy [facility] for tasks assessed/performed              Past Medical History:  Diagnosis Date   Allergy    Anemia    COVID-19 2022   Diabetes mellitus without complication (HCC)    Hypertension    Hyperthyroidism    Thyroid disease    hyperthyroidism   Past Surgical History:  Procedure Laterality Date   CHOLECYSTECTOMY  01/20/2011   Procedure: LAPAROSCOPIC CHOLECYSTECTOMY;  Surgeon: Rulon Abide, DO;  Location: [facility];  Service: General;  Laterality: N/A;   DILATATION & CURETTAGE/HYSTEROSCOPY WITH MYOSURE N/A 09/21/2021   Procedure: DILATATION & CURETTAGE/HYSTEROSCOPY WITH MYOSURE;  Surgeon: Jaymes Graff, MD;  Location: [facility] OR;  Service: Gynecology;  Laterality: N/A;  rep will be here confirmed on 09/13/21 CS   KNEE SURGERY  08/2007, 10/2009   right knee- remove fibroma   Patient Active Problem List   Diagnosis Date Noted   Essential hypertension 01/04/2023   Deficiency anemia 05/26/2022   History of uterine fibroid 04/29/2022   Obesity, Class III, BMI 40-49.9 (morbid obesity) (HCC) 09/04/2017   Type 2 diabetes mellitus without complications (HCC) 04/12/2016   MONONUCLEOSIS 05/21/2009   IRON DEFICIENCY ANEMIA, HX OF 05/21/2009    PCP: Evelena Peat, MD  REFERRING PROVIDER: Rich Brave, MD  REFERRING DIAG: Chronic R shoulder pain, ACJ arthritis on XR and POCUS, weakness of RTC; evaluate/treat with hopes of being able to bowl again, may benefit from HEP   THERAPY DIAG:  Stiffness of right shoulder, not  elsewhere classified  Dysfunction of right rotator cuff  Chronic right shoulder pain  Rationale for Evaluation and Treatment: Rehabilitation  ONSET DATE: July 2024  SUBJECTIVE:                                                                                                                                                                                      SUBJECTIVE STATEMENT: Reports overall improved Sx R shoulder.  Has been performing ex without difficulty. Sleeping better Hand dominance: Right  PERTINENT HISTORY: R shoulder injury, has tried various types of medications, saw sports med  MD and was referred to PT  PAIN:  Are you having pain? Yes: NPRS scale: 2 to6 Pain location: R superior /ant shoulder Pain description: ache, deep Aggravating factors: sleeping on that side, bowling, overuse Relieving factors: meds help  PRECAUTIONS: None  RED FLAGS: None   WEIGHT BEARING RESTRICTIONS: No  FALLS:  Has patient fallen in last 6 months? 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  DIAGNOSTIC FINDINGS:  OA R AC jt  PATIENT SURVEYS:  Quick Dash QuickDASH Score: 25.0 / 100 = 25.0 %  COGNITION: Overall cognitive status: Within functional limits for tasks assessed     SENSATION: WFL  POSTURE: R shoulder protracted, humeral head sitting forward in glenoid  UPPER EXTREMITY ROM:   Active ROM Right eval Left eval  Shoulder flexion 130 160  Shoulder extension    Shoulder abduction    Shoulder adduction    Shoulder internal rotation    Shoulder external rotation     Elbow flexion    Elbow extension    Wrist flexion    Wrist extension    Wrist ulnar deviation    Wrist radial deviation    Wrist pronation    Wrist supination    (Blank rows = not tested)  UPPER EXTREMITY MMT:  MMT Right eval Left eval  Shoulder flexion 4 wfl  Shoulder extension 4-   Shoulder abduction    Shoulder adduction    Shoulder internal rotation 4 wfl  Shoulder external rotation 4+ 4  Middle trapezius 3+ 4-  Lower trapezius    Elbow flexion    Elbow extension    Wrist flexion    Wrist extension    Wrist ulnar deviation    Wrist radial deviation    Wrist pronation    Wrist supination    Grip strength (lbs)    (Blank rows = not tested)  SHOULDER SPECIAL TESTS: Impingement tests: Neer impingement test: positive  and Hawkins/Kennedy impingement test: positive   Rotator cuff assessment: External rotation lag sign: negative and Belly press test: negative   JOINT MOBILITY TESTING:  Normal mobility R GH jt for AP glides, PA glides, Inf glides  PALPATION:  Quite Tender over long and short head of biceps,mild over R supraspinatus, very tender subscapularis                                                                                                                             TREATMENT DATE:  04/04/23:  Reassessed R shoulder AROM:  Flexion greater than 140 today  Reassessed MMT R shoulder: R shoulder flexion 4+/5, IR 4+/5,  ER wnl R biceps 4+5 Manual: Pt with thickened and tender R biceps muscle belly and long head biceps, so utilized cross friction massage , as well as TPDN: Trigger Point Dry Needling  Initial Treatment: Pt instructed on Dry Needling rational, procedures, and possible side effects. Pt instructed to expect mild to moderate muscle soreness later in the day and/or into the next day.  Pt instructed in methods to reduce muscle soreness. Pt instructed to continue prescribed HEP. Because Dry Needling was performed over or adjacent to a lung  field, pt was educated on S/S of pneumothorax and to seek immediate medical attention should they occur.  Patient was educated on signs and symptoms of infection and other risk factors and advised to seek medical attention should they occur.  Patient verbalized understanding of these instructions and education.   Patient Verbal Consent Given: Yes Education Handout Provided: Yes Muscles Treated: R biceps mid portion and proximal attachment Treatment Response/Outcome: twitch response and elongation of muscle tissue  Supine for inf glides R GH jt 3 bouts , to improve positioning of humeral head in acetabulum.  Kinesiotaping, 2 I pieces extending from lat deltoid to upper traps and ant deltoid to middle traps  Instructed in isometrics for R shoulder flexion/ant delts, as well as long head of biceps, with R elbow extended in door frame.  Also instructed in PNF pattern lowering from flex, add, IR with 2 # wt in standing, with passive assistance from L arm to lift, and eccentric lowering.    Emphasis on pain free movement     03/21/23 Evaluation, education regarding anatomy, mechanics of shoulder.  Kinesiotaping R shoulder 2 I pieces to achieve support of R biceps long head as well as R supraspinatus Instructed in therex to engage R middle traps and R shoulder extensors, advised to perform high reps and daily exercise as these muscles are endurance muscles.   PATIENT EDUCATION: Education details: POC, goals Person educated: Patient Education method: Programmer, multimedia, Demonstration, Tactile cues, Verbal cues, and Handouts Education comprehension: verbalized understanding, returned demonstration, and verbal cues required  HOME EXERCISE PROGRAM: Inst in prone rows with ER Prone shoulder extension, provided with printed copies from med bridge  ASSESSMENT:  CLINICAL IMPRESSION: Patient is a 43 y.o. female who participated in skilled physical therapy today by physical therapy due to R shoulder  injury which occurred 8 months ago when carrying her bowling [place] the [place].  Today her active motion and the tissue irritability of her R shoulder musculature is much improved, although she was still tender with palpation R biceps and supraspinatus.  Progressed with isometrics and eccentrics today, she performed well.   She wishes to continue to space out her PT visits due to the amt of her deductible and cost of visits.  We scheduled her again in 2 weeks. She is a good candidate for skilled PT to address her deficits, goals, improve her tolerance for ADL's and ability to return to bowling.  OBJECTIVE IMPAIRMENTS: decreased ROM, decreased strength, hypomobility, impaired flexibility, impaired UE functional use, and pain.   ACTIVITY LIMITATIONS: carrying, lifting, sleeping, and reach over head  PARTICIPATION LIMITATIONS: cleaning, laundry, and community activity  PERSONAL FACTORS: Behavior pattern, Fitness, Past/current experiences, Time since onset of injury/illness/exacerbation, and 1-2 comorbidities: DM, HTN, financial restrictions  are also affecting patient's functional outcome.   REHAB POTENTIAL: Good  CLINICAL DECISION MAKING: Evolving/moderate complexity  EVALUATION COMPLEXITY: Moderate   GOALS: Goals reviewed with patient? Yes  SHORT TERM GOALS:  Target date: 2 weeks 04/04/23  I HEP Baseline:initiated at eval Goal status: INITIAL    LONG TERM GOALS: Target date: 05/16/23  Strength R shoulder for IR improve to 5/5 from 4/5, improve B middle traps strength to 4+/5 from 3+/5 Baseline:  Goal status: INITIAL  2.  Improve QuickDASH Score: 25.0 / 100 = 25.0 % to  15% or less disability Baseline:  Goal status: INITIAL  3.   R shoulder flexion AROM 145 or greater Baseline: 130 Goal status: INITIAL  4.  Able to bowl competitively without lingering pain R shoulder Baseline:  Goal status: INITIAL   PLAN:  PT FREQUENCY: 2x/month  PT DURATION: 8  weeks  PLANNED INTERVENTIONS: 97110-Therapeutic exercises, 97530- Therapeutic activity, 97112- Neuromuscular re-education, 97535- Self Care, and 82956- Manual therapy  PLAN FOR NEXT SESSION: review and retest Rom and strength R shoulder, advance therex as needed, manual techniques including jt mobs and possible TPDN.   Rosell Khouri L Alecxander Mainwaring, PT, DPT, OCS 04/04/2023, 4:07 PM

## 2023-04-18 ENCOUNTER — Encounter: Payer: Self-pay | Admitting: Physical Therapy

## 2023-04-18 ENCOUNTER — Ambulatory Visit: Payer: BC Managed Care – PPO | Attending: Family Medicine | Admitting: Physical Therapy

## 2023-04-18 DIAGNOSIS — M25611 Stiffness of right shoulder, not elsewhere classified: Secondary | ICD-10-CM | POA: Insufficient documentation

## 2023-04-18 DIAGNOSIS — M25511 Pain in right shoulder: Secondary | ICD-10-CM | POA: Diagnosis not present

## 2023-04-18 DIAGNOSIS — M67911 Unspecified disorder of synovium and tendon, right shoulder: Secondary | ICD-10-CM | POA: Insufficient documentation

## 2023-04-18 DIAGNOSIS — G8929 Other chronic pain: Secondary | ICD-10-CM | POA: Diagnosis not present

## 2023-04-18 NOTE — Therapy (Signed)
 OUTPATIENT PHYSICAL THERAPY SHOULDER TREATMENT   Patient Name: Misty Salazar MRN: [id number] DOB:July 26, 1980, 43 y.o., female Today's Date: 04/18/2023  END OF SESSION:  PT End of Session - 04/18/23 0855     Visit Number 3    Date for PT Re-Evaluation 05/16/23    Progress Note Due on Visit 10    PT Start Time 0855    PT Stop Time 0935    PT Time Calculation (min) 40 min    Activity Tolerance Patient tolerated treatment well    Behavior During Therapy [facility] for tasks assessed/performed               Past Medical History:  Diagnosis Date   Allergy    Anemia    COVID-19 2022   Diabetes mellitus without complication (HCC)    Hypertension    Hyperthyroidism    Thyroid disease    hyperthyroidism   Past Surgical History:  Procedure Laterality Date   CHOLECYSTECTOMY  01/20/2011   Procedure: LAPAROSCOPIC CHOLECYSTECTOMY;  Surgeon: Rulon Abide, DO;  Location: [facility];  Service: General;  Laterality: N/A;   DILATATION & CURETTAGE/HYSTEROSCOPY WITH MYOSURE N/A 09/21/2021   Procedure: DILATATION & CURETTAGE/HYSTEROSCOPY WITH MYOSURE;  Surgeon: Jaymes Graff, MD;  Location: [facility];  Service: Gynecology;  Laterality: N/A;  rep will be here confirmed on 09/13/21 CS   KNEE SURGERY  08/2007, 10/2009   right knee- remove fibroma   Patient Active Problem List   Diagnosis Date Noted   Essential hypertension 01/04/2023   Deficiency anemia 05/26/2022   History of uterine fibroid 04/29/2022   Obesity, Class III, BMI 40-49.9 (morbid obesity) (HCC) 09/04/2017   Type 2 diabetes mellitus without complications (HCC) 04/12/2016   MONONUCLEOSIS 05/21/2009   IRON DEFICIENCY ANEMIA, HX OF 05/21/2009    PCP: Evelena Peat, MD  REFERRING PROVIDER: Rich Brave, MD  REFERRING DIAG: Chronic R shoulder pain, ACJ arthritis on XR and POCUS, weakness of RTC; evaluate/treat with hopes of being able to bowl again, may benefit from HEP   THERAPY DIAG:  Stiffness of right shoulder, not  elsewhere classified  Dysfunction of right rotator cuff  Chronic right shoulder pain  Rationale for Evaluation and Treatment: Rehabilitation  ONSET DATE: July 2024   SUBJECTIVE:                                                                                                                                                                                      SUBJECTIVE STATEMENT: Reports benefit from DN. No issues with day to day activities but shoulder remains sore and still unable to resume bowling.  She states she ran out of the diclofenac and  has been taking OTC pain meds (ibuprofen or Tylenol) but not as effective.  Hand dominance: Right  PERTINENT HISTORY: R shoulder injury, has tried various types of medications, saw sports med MD and was referred to PT  PAIN:  Are you having pain? Yes: NPRS scale: 7/10 Pain location: R superior /ant shoulder Pain description: ache, deep Aggravating factors: sleeping on that side, bowling, overuse Relieving factors: meds help  PRECAUTIONS: None  RED FLAGS: None   WEIGHT BEARING RESTRICTIONS: No  FALLS:  Has patient fallen in last 6 months? 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  DIAGNOSTIC FINDINGS:  OA R AC jt  PATIENT SURVEYS:  Quick Dash QuickDASH Score: 25.0 / 100 = 25.0 %  COGNITION: Overall cognitive status: Within functional limits for tasks assessed     SENSATION: WFL  POSTURE: R shoulder protracted, humeral head sitting forward in glenoid  UPPER EXTREMITY ROM:   Active ROM Right eval Left eval R 04/04/23 R  04/18/23  Shoulder flexion 130 160 >140 155  Shoulder extension      Shoulder abduction    WFL - pain mid range  Shoulder adduction      Shoulder internal rotation    FIR - mildly limited (uncomfortable)  Shoulder external rotation      Elbow flexion      Elbow extension      Wrist flexion      Wrist extension      Wrist ulnar deviation      Wrist radial deviation      Wrist pronation      Wrist supination      (Blank rows = not tested)  UPPER EXTREMITY MMT:  MMT Right eval Left eval R 04/18/23 L 04/18/23  Shoulder flexion 4 wfl 4+ 5  Shoulder extension 4-  4 4+  Shoulder abduction   4+ 5  Shoulder adduction      Shoulder internal rotation 4 wfl 4+ 5  Shoulder external rotation 4+ 4 4+ 4+  Middle trapezius 3+ 4- 4 4  Lower trapezius   3 3+  Elbow flexion      Elbow extension      Wrist flexion      Wrist extension      Wrist ulnar deviation      Wrist radial deviation      Wrist pronation      Wrist supination      Grip strength (lbs)      (Blank rows = not tested)  SHOULDER SPECIAL TESTS: Impingement tests: Neer impingement test: positive  and Hawkins/Kennedy impingement test: positive   Rotator cuff assessment: External rotation lag sign: negative and Belly press test: negative   JOINT MOBILITY TESTING:  Normal mobility R GH jt for AP glides, PA glides, Inf glides  PALPATION:  Quite Tender over long and short head of biceps,mild over R supraspinatus, very tender subscapularis  TREATMENT DATE:   04/18/23 THERAPEUTIC EXERCISE: To improve strength and endurance.  Demonstration, verbal and tactile cues throughout for technique.  UBE: L1.0 x 6 min (3' each fwd and back) Standing B lower trap setting at wall (V wall slide + slight lift-off) 10 x 3"  MANUAL THERAPY: To promote normalized muscle tension, improved flexibility, improved joint  mobility, increased ROM, and reduced pain. Trigger Point Dry Needling: Treatment instructions/education: Subsequent Treatment: Instructions provided previously at initial dry needling treatment.  Education Handout Provided: Previously Provided Consent: Patient Verbal Consent Given: Yes Treatment: Muscles Treated: R anterior deltoid & pecs Skilled palpation and monitoring of soft tissue during DN Electrical Stimulation Performed: No Treatment Response/Outcome: Twitch Response Elicited, Palpable Increase in Muscle Length, Decreased TTP, and Improved Exercise Tolerance STM/DTM, manual TPR and pin & stretch to muscles addressed with DN   04/04/23:  Reassessed R shoulder AROM:  Flexion greater than 140 today  Reassessed MMT R shoulder: R shoulder flexion 4+/5, IR 4+/5, ER wnl R biceps 4+5 Manual: Pt with thickened and tender R biceps muscle belly and long head biceps, so utilized cross friction massage , as well as TPDN: Trigger Point Dry Needling  Initial Treatment: Pt instructed on Dry Needling rational, procedures, and possible side effects. Pt instructed to expect mild to moderate muscle soreness later in the day and/or into the next day.  Pt instructed in methods to reduce muscle soreness. Pt instructed to continue prescribed HEP. Because Dry Needling was performed over or adjacent to a lung field, pt was educated on S/S of pneumothorax and to seek immediate medical attention should they occur.  Patient was educated on signs and symptoms of infection and other risk factors and advised to seek medical attention should they occur.  Patient verbalized understanding of these instructions and education.   Patient Verbal Consent Given: Yes Education Handout Provided: Yes Muscles Treated: R biceps mid portion and proximal attachment Treatment Response/Outcome: twitch response and elongation of muscle tissue  Supine for inf glides R GH jt 3 bouts , to improve positioning of humeral head  in acetabulum.  Kinesiotaping, 2 I pieces extending from lat deltoid to upper traps and ant deltoid to middle traps  Instructed in isometrics for R shoulder flexion/ant delts, as well as long head of biceps, with R elbow extended in door frame.  Also instructed in PNF pattern lowering from flex, add, IR with 2 # wt in standing, with passive assistance from L arm to lift, and eccentric lowering.    Emphasis on pain free movement    03/21/23 Evaluation, education regarding anatomy, mechanics of shoulder.  Kinesiotaping R shoulder 2 I pieces to achieve support of R biceps long head as well as R supraspinatus Instructed in therex to engage R middle traps and R shoulder extensors, advised to perform high reps and daily exercise as these muscles are endurance muscles.   PATIENT EDUCATION: Education details: HEP review and HEP update - lower trap setting   Person educated: Patient Education method: Explanation, Demonstration, Tactile cues, Verbal cues, and Handouts Education comprehension: verbalized understanding, returned demonstration, and verbal cues required  HOME EXERCISE PROGRAM: Inst in prone rows with ER Prone shoulder extension, provided with printed copies from med bridge B lower trap setting at wall (V wall slide + slight lift-off)   ASSESSMENT:  CLINICAL IMPRESSION: Aubery continues to demonstrate gains in R shoulder ROM but still notes a pulling sensation in her anterior shoulder with OH ROM and continues to report moderate levels of pain.  Taut, tender bands and TPs identified in R anterior deltoid and lateral pecs - addressed with MT incorporating TPDN with good twitch responses elicited resulting in palpable reduction in muscle tightness and TTP with improved ROM/exercise tolerance.  She reports no concerns with the HEP and denies need for review.  Weakness still most prevalent in scapular musculature, esp lower traps therefore worked on progression of scapular muscle  engagement with emphasis on lower traps.  Deferred reapplication of Ktape today as pt wants to focus on more active awareness of posture and scapular muscle engagement, but she states her husband has applied the tape for her in the past if she feels like she needs it.  Dalilah will benefit from continued skilled PT to address ongoing deficits to improve mobility and activity tolerance with decreased pain interference.   OBJECTIVE IMPAIRMENTS: decreased ROM, decreased strength, hypomobility, impaired flexibility, impaired UE functional use, and pain.   ACTIVITY LIMITATIONS: carrying, lifting, sleeping, and reach over head  PARTICIPATION LIMITATIONS: cleaning, laundry, and community activity  PERSONAL FACTORS: Behavior pattern, Fitness, Past/current experiences, Time since onset of injury/illness/exacerbation, and 1-2 comorbidities: DM, HTN, financial restrictions  are also affecting patient's functional outcome.   REHAB POTENTIAL: Good  CLINICAL DECISION MAKING: Evolving/moderate complexity  EVALUATION COMPLEXITY: Moderate   GOALS: Goals reviewed with patient? Yes  SHORT TERM GOALS: Target date: 2 weeks 04/04/23  I HEP Baseline:initiated at eval Goal status: MET - 04/18/23  LONG TERM GOALS: Target date: 05/16/23  Strength R shoulder for IR improve to 5/5 from 4/5, improve B middle traps strength to 4+/5 from 3+/5 Baseline:  Goal status: IN PROGRESS - 04/18/23  2.  Improve QuickDASH Score: 25.0 / 100 = 25.0 % to  15% or less disability Baseline:  Goal status: INITIAL  3.   R shoulder flexion AROM 145 or greater Baseline: 130 Goal status: MET - 04/18/23  4.  Able to bowl competitively without lingering pain R shoulder Baseline:  Goal status: INITIAL   PLAN:  PT FREQUENCY: 2x/month  PT DURATION: 8 weeks  PLANNED INTERVENTIONS: 97110-Therapeutic exercises, 97530- Therapeutic activity, 97112- Neuromuscular re-education, 97535- Self Care, and 16109- Manual therapy  PLAN FOR NEXT  SESSION: review and retest ROM and strength R shoulder, advance therex as needed, manual techniques including jt mobs and possible TPDN.   Marry Guan, PT 04/18/2023, 9:35 AM

## 2023-05-01 ENCOUNTER — Ambulatory Visit (INDEPENDENT_AMBULATORY_CARE_PROVIDER_SITE_OTHER): Payer: BC Managed Care – PPO | Admitting: Family Medicine

## 2023-05-01 ENCOUNTER — Encounter: Payer: Self-pay | Admitting: Family Medicine

## 2023-05-01 VITALS — BP 132/84 | HR 83 | Temp 98.3°F | Ht 62.99 in | Wt 306.9 lb

## 2023-05-01 DIAGNOSIS — Z0000 Encounter for general adult medical examination without abnormal findings: Secondary | ICD-10-CM | POA: Diagnosis not present

## 2023-05-01 DIAGNOSIS — I10 Essential (primary) hypertension: Secondary | ICD-10-CM

## 2023-05-01 DIAGNOSIS — E119 Type 2 diabetes mellitus without complications: Secondary | ICD-10-CM

## 2023-05-01 DIAGNOSIS — Z862 Personal history of diseases of the blood and blood-forming organs and certain disorders involving the immune mechanism: Secondary | ICD-10-CM

## 2023-05-01 DIAGNOSIS — Z7985 Long-term (current) use of injectable non-insulin antidiabetic drugs: Secondary | ICD-10-CM

## 2023-05-01 LAB — LIPID PANEL
Cholesterol: 143 mg/dL (ref 0–200)
HDL: 48.8 mg/dL (ref >39.00)
LDL Cholesterol: 78 mg/dL (ref 0–99)
NonHDL: 94.41
Total CHOL/HDL Ratio: 3
Triglycerides: 82 mg/dL (ref 0.0–149.0)
VLDL: 16.4 mg/dL (ref 0.0–40.0)

## 2023-05-01 LAB — CBC WITH DIFFERENTIAL/PLATELET
Basophils Absolute: 0 10*3/uL (ref 0.0–0.1)
Basophils Relative: 0.7 % (ref 0.0–3.0)
Eosinophils Absolute: 0.2 10*3/uL (ref 0.0–0.7)
Eosinophils Relative: 4 % (ref 0.0–5.0)
HCT: 37.2 % (ref 36.0–46.0)
Hemoglobin: 12.1 g/dL (ref 12.0–15.0)
Lymphocytes Relative: 33.4 % (ref 12.0–46.0)
Lymphs Abs: 2 10*3/uL (ref 0.7–4.0)
MCHC: 32.6 g/dL (ref 30.0–36.0)
MCV: 85 fl (ref 78.0–100.0)
Monocytes Absolute: 0.3 10*3/uL (ref 0.1–1.0)
Monocytes Relative: 5.1 % (ref 3.0–12.0)
Neutro Abs: 3.4 10*3/uL (ref 1.4–7.7)
Neutrophils Relative %: 56.8 % (ref 43.0–77.0)
Platelets: 321 10*3/uL (ref 150.0–400.0)
RBC: 4.37 Mil/uL (ref 3.87–5.11)
RDW: 14 % (ref 11.5–15.5)
WBC: 6 10*3/uL (ref 4.0–10.5)

## 2023-05-01 LAB — BASIC METABOLIC PANEL
BUN: 6 mg/dL (ref 6–23)
CO2: 31 meq/L (ref 19–32)
Calcium: 9.6 mg/dL (ref 8.4–10.5)
Chloride: 102 meq/L (ref 96–112)
Creatinine, Ser: 0.64 mg/dL (ref 0.40–1.20)
GFR: 108.1 mL/min (ref >60.00)
Glucose, Bld: 90 mg/dL (ref 70–99)
Potassium: 4.2 meq/L (ref 3.5–5.1)
Sodium: 139 meq/L (ref 135–145)

## 2023-05-01 LAB — HEPATIC FUNCTION PANEL
ALT: 17 U/L (ref 0–35)
AST: 15 U/L (ref 0–37)
Albumin: 4 g/dL (ref 3.5–5.2)
Alkaline Phosphatase: 63 U/L (ref 39–117)
Bilirubin, Direct: 0.1 mg/dL (ref 0.0–0.3)
Total Bilirubin: 0.5 mg/dL (ref 0.2–1.2)
Total Protein: 8.3 g/dL (ref 6.0–8.3)

## 2023-05-01 LAB — MICROALBUMIN / CREATININE URINE RATIO
Creatinine,U: 106.8 mg/dL
Microalb Creat Ratio: 69.7 mg/g — ABNORMAL HIGH (ref 0.0–30.0)
Microalb, Ur: 7.4 mg/dL — ABNORMAL HIGH (ref 0.0–1.9)

## 2023-05-01 LAB — HEMOGLOBIN A1C: Hgb A1c MFr Bld: 5.8 % (ref 4.6–6.5)

## 2023-05-01 LAB — TSH: TSH: 0.97 u[IU]/mL (ref 0.35–5.50)

## 2023-05-01 NOTE — Progress Notes (Signed)
 Established Patient Office Visit  Subjective   Patient ID: Misty Salazar, female    DOB: 1980-11-25  Age: 42 y.o. MRN: [id number]  Chief Complaint  Patient presents with   Annual Exam    HPI   Misty Salazar is seen for physical exam.   She is followed by GYN for Pap smears and states that is up-to-date.  She is also followed by endocrine for her Ozempic.  She has had diabetes range blood sugars previously but latest A1c's have been 5 range.  She has had the frustration of weight really not going down as she expected it might on Ozempic 1 mg once weekly.  He plans to see her endocrinologist soon.  She does have history of iron deficiency and she is requesting iron studies.  Last studies were done about 4 months ago and normal.  She has had iron transfusions previously.  She had eye exam back in October.  She has been diligent with exercise several days per week and usually exercise about 45 minutes to an hour with good combination of resistance and aerobic.  She has hypertension currently treated with amlodipine and losartan.  Health maintenance reviewed:  Health Maintenance  Topic Date Due   Cervical Cancer Screening (HPV/Pap Cotest)  10/08/2017   Pneumococcal Vaccine 83-45 Years old (2 of 2 - PCV) 09/05/2018   FOOT EXAM  04/27/2022   HEMOGLOBIN A1C  08/12/2022   COVID-19 Vaccine (6 - 2024-25 season) 10/09/2022   Diabetic kidney evaluation - eGFR measurement  04/29/2023   HIV Screening  04/13/2026 (Originally 09/23/1994)   Diabetic kidney evaluation - Urine ACR  04/29/2027 (Originally 04/27/2022)   OPHTHALMOLOGY EXAM  12/19/2023   DTaP/Tdap/Td (4 - Td or Tdap) 04/28/2032   INFLUENZA VACCINE  Completed   Hepatitis C Screening  Completed   HPV VACCINES  Aged Out   Family history-her mother had uterine cancer and type 2 diabetes.  Both parents had hypertension and hyperlipidemia.  Father also had type 2 diabetes.  Brother with hypertension.  Couple grandparents with stroke  history.   Social history-married.  No children.  Rare alcohol.  Non-smoker.  Works from home.  [employer]  Past Medical History:  Diagnosis Date   Allergy    Anemia    COVID-19 2022   Diabetes mellitus without complication (HCC)    Hypertension    Hyperthyroidism    Thyroid disease    hyperthyroidism   Past Surgical History:  Procedure Laterality Date   CHOLECYSTECTOMY  01/20/2011   Procedure: LAPAROSCOPIC CHOLECYSTECTOMY;  Surgeon: Rulon Abide, DO;  Location: [facility];  Service: General;  Laterality: N/A;   DILATATION & CURETTAGE/HYSTEROSCOPY WITH MYOSURE N/A 09/21/2021   Procedure: DILATATION & CURETTAGE/HYSTEROSCOPY WITH MYOSURE;  Surgeon: Jaymes Graff, MD;  Location: [facility];  Service: Gynecology;  Laterality: N/A;  rep will be here confirmed on 09/13/21 CS   KNEE SURGERY  08/2007, 10/2009   right knee- remove fibroma    reports that she has never smoked. She has never used smokeless tobacco. She reports current alcohol use. She reports that she does not use drugs. family history includes Cancer in her maternal grandmother and mother; Diabetes in her father and mother; Hyperlipidemia in her father and mother; Hypertension in her brother, father, and mother; Stroke in her maternal grandfather, maternal grandmother, and paternal grandfather. Allergies  Allergen Reactions   Oxycodone-Acetaminophen Itching and Hives   Percocet [Oxycodone-Acetaminophen] Hives and Itching     Review of Systems  Constitutional:  Negative for chills, fever, malaise/fatigue and weight loss.  HENT:  Negative for hearing loss.   Eyes:  Negative for blurred vision and double vision.  Respiratory:  Negative for cough and shortness of breath.   Cardiovascular:  Negative for chest pain, palpitations and leg swelling.  Gastrointestinal:  Negative for abdominal pain, blood in stool, constipation and diarrhea.  Genitourinary:  Negative for dysuria.  Skin:  Negative for rash.   Neurological:  Negative for dizziness, speech change, seizures, loss of consciousness and headaches.  Psychiatric/Behavioral:  Negative for depression.       Objective:     BP 132/84 (BP Location: Left Arm, Patient Position: Sitting, Cuff Size: Large)   Pulse 83   Temp 98.3 F (36.8 C) (Oral)   Ht 5' 2.99" (1.6 m)   Wt (!) 306 lb 14.4 oz (139.2 kg)   SpO2 95%   BMI 54.38 kg/m  BP Readings from Last 3 Encounters:  05/01/23 132/84  03/08/23 122/82  01/04/23 (!) 148/88   Wt Readings from Last 3 Encounters:  05/01/23 (!) 306 lb 14.4 oz (139.2 kg)  03/08/23 299 lb (135.6 kg)  01/04/23 (!) 304 lb 12.8 oz (138.3 kg)      Physical Exam Vitals reviewed.  Constitutional:      General: She is not in acute distress.    Appearance: She is well-developed.  HENT:     Right Ear: Tympanic membrane normal.     Left Ear: Tympanic membrane normal.  Eyes:     Pupils: Pupils are equal, round, and reactive to light.  Neck:     Thyroid: No thyromegaly.     Vascular: No JVD.  Cardiovascular:     Rate and Rhythm: Normal rate and regular rhythm.     Heart sounds:     No gallop.  Pulmonary:     Effort: Pulmonary effort is normal. No respiratory distress.     Breath sounds: Normal breath sounds. No wheezing or rales.  Abdominal:     Palpations: Abdomen is soft. There is no mass.     Tenderness: There is no abdominal tenderness. There is no guarding or rebound.  Musculoskeletal:     Cervical back: Neck supple.     Right lower leg: No edema.     Left lower leg: No edema.  Neurological:     Mental Status: She is alert.      No results found for any visits on 05/01/23.    The 10-year ASCVD risk score (Arnett DK, et al., 2019) is: 4.7%    Assessment & Plan:   Problem List Items Addressed This Visit       Unprioritized   Essential hypertension - Primary   IRON DEFICIENCY ANEMIA, HX OF   Relevant Orders   Iron, TIBC and Ferritin Panel   Type 2 diabetes mellitus without  complications (HCC)   Relevant Orders   Hemoglobin A1c   Microalbumin / creatinine urine ratio   Other Visit Diagnoses       Physical exam       Relevant Orders   Basic metabolic panel   Lipid panel   CBC with Differential/Platelet   Hepatic function panel     Morbid obesity (HCC)       Relevant Orders   TSH     Misty Salazar is here today for physical exam.  She sees gynecologist for Pap smear and mammogram and Pap smears up-to-date.  She does have history of iron deficiency anemia and requesting repeat iron  studies.  She has been frustrated with her inability to lose weight on Ozempic and plans to discuss with her endocrinologist.  We did mention potential benefits of switching to [facility] for better weight loss potential.  She is considering possibility of having gastric bypass if this is not effective.  No follow-ups on file.    Evelena Peat, MD

## 2023-05-02 ENCOUNTER — Ambulatory Visit: Payer: BC Managed Care – PPO

## 2023-05-02 DIAGNOSIS — M67911 Unspecified disorder of synovium and tendon, right shoulder: Secondary | ICD-10-CM

## 2023-05-02 DIAGNOSIS — M25511 Pain in right shoulder: Secondary | ICD-10-CM | POA: Diagnosis not present

## 2023-05-02 DIAGNOSIS — G8929 Other chronic pain: Secondary | ICD-10-CM | POA: Diagnosis not present

## 2023-05-02 DIAGNOSIS — M25611 Stiffness of right shoulder, not elsewhere classified: Secondary | ICD-10-CM

## 2023-05-02 LAB — IRON,TIBC AND FERRITIN PANEL
%SAT: 16 % (ref 16–45)
Ferritin: 86 ng/mL (ref 16–232)
Iron: 50 ug/dL (ref 40–190)
TIBC: 315 ug/dL (ref 250–450)

## 2023-05-02 NOTE — Therapy (Signed)
 OUTPATIENT PHYSICAL THERAPY SHOULDER TREATMENT   Patient Name: Misty Salazar MRN: [id number] DOB:27-Feb-1980, 43 y.o., female Today's Date: 05/02/2023  END OF SESSION:  PT End of Session - 05/02/23 0854     Visit Number 4    Date for PT Re-Evaluation 05/16/23    Progress Note Due on Visit 10    PT Start Time 0845    PT Stop Time 0930    PT Time Calculation (min) 45 min    Activity Tolerance Patient tolerated treatment well    Behavior During Therapy [facility] for tasks assessed/performed                Past Medical History:  Diagnosis Date   Allergy    Anemia    COVID-19 2022   Diabetes mellitus without complication (HCC)    Hypertension    Hyperthyroidism    Thyroid disease    hyperthyroidism   Past Surgical History:  Procedure Laterality Date   CHOLECYSTECTOMY  01/20/2011   Procedure: LAPAROSCOPIC CHOLECYSTECTOMY;  Surgeon: Rulon Abide, DO;  Location: [facility];  Service: General;  Laterality: N/A;   DILATATION & CURETTAGE/HYSTEROSCOPY WITH MYOSURE N/A 09/21/2021   Procedure: DILATATION & CURETTAGE/HYSTEROSCOPY WITH MYOSURE;  Surgeon: Jaymes Graff, MD;  Location: [facility];  Service: Gynecology;  Laterality: N/A;  rep will be here confirmed on 09/13/21 CS   KNEE SURGERY  08/2007, 10/2009   right knee- remove fibroma   Patient Active Problem List   Diagnosis Date Noted   Essential hypertension 01/04/2023   Deficiency anemia 05/26/2022   History of uterine fibroid 04/29/2022   Obesity, Class III, BMI 40-49.9 (morbid obesity) (HCC) 09/04/2017   Type 2 diabetes mellitus without complications (HCC) 04/12/2016   MONONUCLEOSIS 05/21/2009   IRON DEFICIENCY ANEMIA, HX OF 05/21/2009    PCP: Evelena Peat, MD  REFERRING PROVIDER: Rich Brave, MD  REFERRING DIAG: Chronic R shoulder pain, ACJ arthritis on XR and POCUS, weakness of RTC; evaluate/treat with hopes of being able to bowl again, may benefit from HEP   THERAPY DIAG:  Stiffness of right shoulder, not  elsewhere classified  Dysfunction of right rotator cuff  Chronic right shoulder pain  Rationale for Evaluation and Treatment: Rehabilitation  ONSET DATE: July 2024   SUBJECTIVE:                                                                                                                                                                                      SUBJECTIVE STATEMENT:05/02/23:  reports recurrent "soreness " R shoulder, points to R clavicle, medially and distally.  Lifting a bowling ball this weekend, has been going to gym and trying to perform  light work outs, R shoulder ached a lot this weekend, not sure if lifting the bowling ball hurt it.  Reports benefit from DN. No issues with day to day activities but shoulder remains sore and still unable to resume bowling.  She states she ran out of the diclofenac and has been taking OTC pain meds (ibuprofen or Tylenol) but not as effective.  Hand dominance: Right  PERTINENT HISTORY: R shoulder injury, has tried various types of medications, saw sports med MD and was referred to PT  PAIN:  Are you having pain? Yes: NPRS scale: 7/10 Pain location: R superior /ant shoulder Pain description: ache, deep Aggravating factors: sleeping on that side, bowling, overuse Relieving factors: meds help  PRECAUTIONS: None  RED FLAGS: None   WEIGHT BEARING RESTRICTIONS: No  FALLS:  Has patient fallen in last 6 months? 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  DIAGNOSTIC FINDINGS:  OA R AC jt  PATIENT SURVEYS:  Quick Dash QuickDASH  Score: 25.0 / 100 = 25.0 %  COGNITION: Overall cognitive status: Within functional limits for tasks assessed     SENSATION: WFL  POSTURE: R shoulder protracted, humeral head sitting forward in glenoid  UPPER EXTREMITY ROM:   Active ROM Right eval Left eval R 04/04/23 R 04/18/23 R3/25/25  Shoulder flexion 130 160 >140 155 155  Shoulder extension       Shoulder abduction    WFL - pain mid range wfl  Shoulder adduction       Shoulder internal rotation    FIR - mildly limited (uncomfortable) wfl  Shoulder external rotation     wfl  Elbow flexion       Elbow extension       Wrist flexion       Wrist extension       Wrist ulnar deviation       Wrist radial deviation       Wrist pronation       Wrist supination       (Blank rows = not tested)  UPPER EXTREMITY MMT:  MMT Right eval Left eval R 04/18/23 L 04/18/23 R3/25/25  Shoulder flexion 4 wfl 4+ 5 5  Shoulder extension 4-  4 4+ 5  Shoulder abduction   4+ 5 5  Shoulder adduction       Shoulder internal rotation 4 wfl 4+ 5 5  Shoulder external rotation 4+ 4 4+ 4+ 5  Middle trapezius 3+ 4- 4 4 5   Lower trapezius   3 3+ 4  Elbow flexion       Elbow extension       Wrist flexion       Wrist extension       Wrist ulnar deviation       Wrist radial deviation       Wrist pronation       Wrist supination       Grip strength (lbs)       (Blank rows = not tested)  SHOULDER SPECIAL TESTS: Impingement tests: Neer impingement test: positive  and Hawkins/Kennedy impingement test: positive   Rotator cuff assessment: External rotation  lag sign: negative and Belly press test: negative   JOINT MOBILITY TESTING:  Normal mobility R GH jt for AP glides, PA glides, Inf glides  PALPATION:  Quite Tender over long and short head of biceps,mild over R supraspinatus, very tender subscapularis                                                                                                                             TREATMENT DATE:   05/02/23: Manual: prone for PA stretching, glides mid thoracic region Trigger Point Dry Needling  Subsequent Treatment: Instructions provided previously at initial dry needling treatment.   Patient Verbal Consent Given: Yes Education Handout Provided: Previously Provided Muscles Treated: R upper traps, R infraspinatus Electrical Stimulation Performed: No Treatment Response/Outcome: decreased tissue resistance Kinesiotaping, 2 I pieces, extending from lat and ant delts to upper and middle traps, 35% pull to relieve sx.  Therex:  pt already strengthening at gym, added/adapted her current routine as follows:  3 way biceps, 10# palm up and with forearm neutral, 5# palm down 5# kettlebell swings, For/ back, B hands on kettle bell, cues to engage B knees, hips Triceps kick backs with elbow bent 5# Triceps ext with elbow extended 5#   04/18/23 THERAPEUTIC EXERCISE: To improve strength and endurance.  Demonstration, verbal and tactile cues throughout for technique.  UBE: L1.0 x 6 min (3' each fwd and back) Standing B lower trap setting at wall (V wall slide + slight lift-off) 10 x 3"  MANUAL THERAPY: To promote normalized muscle tension, improved flexibility, improved joint mobility, increased ROM, and reduced pain. Trigger Point Dry Needling: Treatment instructions/education: Subsequent Treatment: Instructions provided previously at initial dry needling treatment.  Education Handout Provided: Previously Provided Consent: Patient Verbal Consent Given: Yes Treatment: Muscles Treated: R anterior deltoid & pecs Skilled palpation and monitoring of soft tissue during DN Electrical Stimulation Performed: No Treatment Response/Outcome: Twitch Response Elicited, Palpable Increase in Muscle Length, Decreased TTP, and Improved Exercise Tolerance STM/DTM, manual TPR and pin & stretch to muscles addressed with DN   04/04/23:  Reassessed R shoulder AROM:  Flexion greater than 140  today  Reassessed MMT R shoulder: R shoulder flexion 4+/5, IR 4+/5, ER wnl R biceps 4+5 Manual: Pt with thickened and tender R biceps muscle belly and long head biceps, so utilized cross friction massage , as well as TPDN: Trigger Point Dry Needling  Initial Treatment: Pt instructed on Dry Needling rational, procedures, and possible side effects. Pt instructed to expect mild to moderate muscle soreness later in the day and/or into the next day.  Pt instructed in methods to reduce muscle soreness. Pt instructed to continue prescribed HEP. Because Dry Needling was performed over or adjacent to a lung field, pt was educated on S/S of pneumothorax and to seek immediate medical attention should they occur.  Patient was educated on signs and symptoms of infection and other risk factors and advised to seek medical attention should they occur.  Patient verbalized understanding of these instructions and education.   Patient Verbal Consent Given: Yes Education Handout Provided: Yes Muscles Treated: R biceps mid portion and proximal attachment Treatment Response/Outcome: twitch response and elongation of muscle tissue  Supine for inf glides R GH jt 3 bouts , to improve positioning of humeral head in acetabulum.  Kinesiotaping, 2 I pieces extending from lat deltoid to upper traps and ant deltoid to middle traps  Instructed in isometrics for R shoulder flexion/ant delts, as well as long head of biceps, with R elbow extended in door frame.  Also instructed in PNF pattern lowering from flex, add, IR with 2 # wt in standing, with passive assistance from L arm to lift, and eccentric lowering.    Emphasis on pain free movement    03/21/23 Evaluation, education regarding anatomy, mechanics of shoulder.  Kinesiotaping R shoulder 2 I pieces to achieve support of R biceps long head as well as R supraspinatus Instructed in therex to engage R middle traps and R shoulder extensors, advised to perform high  reps and daily exercise as these muscles are endurance muscles.   PATIENT EDUCATION: Education details: HEP review and HEP update - lower trap setting   Person educated: Patient Education method: Explanation, Demonstration, Tactile cues, Verbal cues, and Handouts Education comprehension: verbalized understanding, returned demonstration, and verbal cues required  HOME EXERCISE PROGRAM: Inst in prone rows with ER Prone shoulder extension, provided with printed copies from med bridge B lower trap setting at wall (V wall slide + slight lift-off)  ASSESSMENT:  CLINICAL IMPRESSION: Karena continues to demonstrate gains in R shoulder ROM and strength, however very 'sore" with palpation distal clavicle, improved with manual techniques but resumed with therex.  At this point she and I discussed that she may benefit from additional evaluation from the sports medicine MD. We did keep her next PT appt but will await further notice regarding how her appt with MD goes.    OBJECTIVE IMPAIRMENTS: decreased ROM, decreased strength, hypomobility, impaired flexibility, impaired UE functional use, and pain.   ACTIVITY LIMITATIONS: carrying, lifting, sleeping, and reach over head  PARTICIPATION LIMITATIONS: cleaning, laundry, and community activity  PERSONAL FACTORS: Behavior pattern, Fitness, Past/current experiences, Time since onset of injury/illness/exacerbation, and 1-2 comorbidities: DM, HTN, financial restrictions  are also affecting patient's functional outcome.   REHAB POTENTIAL: Good  CLINICAL DECISION MAKING: Evolving/moderate complexity  EVALUATION COMPLEXITY: Moderate   GOALS: Goals reviewed with patient? Yes  SHORT TERM GOALS: Target date: 2 weeks 04/04/23  I HEP Baseline:initiated at eval Goal status: MET - 04/18/23  LONG TERM GOALS: Target date: 05/16/23  Strength R shoulder for IR improve to 5/5 from 4/5, improve B middle traps strength to 4+/5 from 3+/5 Baseline:  Goal status:  IN PROGRESS - 04/18/23 05/02/23: met so far  2.  Improve QuickDASH Score: 25.0 / 100 = 25.0 % to  15% or less disability Baseline:  Goal status: INITIAL  3.   R shoulder flexion AROM 145 or greater Baseline: 130 Goal status: MET - 04/18/23 05/02/23: met so far  4.  Able to bowl competitively without lingering pain R shoulder Baseline:  Goal status: INITIAL 05/02/23: not met   PLAN:  PT FREQUENCY: 2x/month  PT DURATION: 8 weeks  PLANNED INTERVENTIONS: 97110-Therapeutic exercises, 97530- Therapeutic activity, 97112- Neuromuscular re-education, 97535- Self Care, and 21308- Manual therapy  PLAN FOR NEXT SESSION: review and retest ROM and strength R shoulder, advance therex as needed, manual techniques including jt mobs and possible TPDN.  Savannaha Stonerock L Ahsha Hinsley, PT, DPT, OCS 05/02/2023, 1:32 PM

## 2023-05-04 ENCOUNTER — Ambulatory Visit (INDEPENDENT_AMBULATORY_CARE_PROVIDER_SITE_OTHER): Admitting: Family Medicine

## 2023-05-04 ENCOUNTER — Other Ambulatory Visit: Payer: Self-pay

## 2023-05-04 ENCOUNTER — Encounter: Payer: Self-pay | Admitting: Family Medicine

## 2023-05-04 VITALS — BP 140/86 | Ht 62.0 in | Wt 305.0 lb

## 2023-05-04 DIAGNOSIS — M19011 Primary osteoarthritis, right shoulder: Secondary | ICD-10-CM | POA: Diagnosis not present

## 2023-05-04 DIAGNOSIS — E119 Type 2 diabetes mellitus without complications: Secondary | ICD-10-CM

## 2023-05-04 MED ORDER — METHYLPREDNISOLONE ACETATE 40 MG/ML IJ SUSP
40.0000 mg | Freq: Once | INTRAMUSCULAR | Status: AC
  Administered 2023-05-04: 40 mg via INTRA_ARTICULAR

## 2023-05-04 NOTE — Patient Instructions (Signed)

## 2023-05-04 NOTE — Assessment & Plan Note (Signed)
 Well-controlled diabetes with A1c 5.8 on 05/01/2023  Plan: -For ultrasound-guided [facility] joint injection today.Was advised may have temporary elevations of blood sugars over the next few days.  She should reach out if she has persistent or severe elevations.  Please see procedure note below -She should continue to check her blood sugars regularly and continue diabetes regimen per PCP

## 2023-05-04 NOTE — Addendum Note (Signed)
 Addended by: Rutha Bouchard E on: 05/04/2023 10:37 AM   Modules accepted: Orders

## 2023-05-04 NOTE — Progress Notes (Signed)
 DATE OF VISIT: 05/04/2023    Misty Salazar DOB: 30-Nov-1980 MRN: [id number]  CC:  f/u Rt shoulder pain  History of present Illness: Misty Salazar is a 43 y.o. female who presents for a follow-up visit for Rt shoulder Previous patient of Dr Cherre Robins at [facility] office, but she left the practice in Feb 2025 At that visit noted to have shoulder pain with AC joint OA - referred to PT - given Rx Voltaren 75mg  PO bid prn - discussed possible AC joint injection if symptoms persist  Today she reports ongoing pain Has completed 4 sessions of PT - feels ROM and strength improving, but still having pain Pain on the top of the shoulder Can't sleep on right side Sometimes worse when washing her face Voltaren was helpful - ran out Taking Ibuprofen 600mg  4 times a day prn  Hx of DM-2 on Ozempic Last A1c= 5.8 on 05/01/23   Medications:  Outpatient Encounter Medications as of 05/04/2023  Medication Sig   amLODipine (NORVASC) 10 MG tablet Take 10 mg by mouth daily.   Azelastine HCl 137 MCG/SPRAY SOLN PLACE 2 SPRAYS INTO BOTH NOSTRILS 2 (TWO) TIMES DAILY. USE IN EACH NOSTRIL AS DIRECTED   cetirizine (ZYRTEC) 10 MG tablet Take 10 mg by mouth daily.   cyclobenzaprine (FLEXERIL) 10 MG tablet Take 1 tablet (10 mg total) by mouth 3 (three) times daily as needed for muscle spasms.   ketotifen (ALAWAY) 0.025 % ophthalmic solution Place 1 drop into both eyes daily as needed (allergies).   losartan (COZAAR) 25 MG tablet Take 25 mg by mouth daily.   Multiple Vitamin (MULTIVITAMIN) tablet Take 1 tablet by mouth daily.   OZEMPIC, 1 MG/DOSE, 4 MG/3ML SOPN Inject 1 mg into the skin once a week.   No facility-administered encounter medications on file as of 05/04/2023.    Allergies: is allergic to oxycodone-acetaminophen and percocet [oxycodone-acetaminophen].  Physical Examination: Vitals: BP (!) 140/86   Ht 5\' 2"  (1.575 m)   Wt (!) 305 lb (138.3 kg)   BMI 55.79 kg/m  GENERAL:  Misty Salazar is a 43 y.o. female appearing their stated age, alert and oriented x 3, in no apparent distress.  SKIN: no rashes or lesions, skin clean, dry, intact MSK:  SHOULDER: Rt shoulder with full range of motion with positive painful arc.  Pain worse with adduction.  Tender to palpation over the [facility] joint, minimal tenderness over the greater tuberosity and the bicipital groove.  Positive crossover test, mildly positive Hawkins, mildly positive Neer, mildly positive empty can, negative speeds.  Rotator cuff strength 5 -/5 throughout. Left shoulder with full range of motion without pain, weakness, instability Normal grip strength bilaterally N/V/I distally  Radiology: XRAY:  Right shoulder-x-ray 3 views 01/05/2023 personally reviewed and interpreted today showing: -Type II acromion -Mild AC joint degenerative changes -No other bony abnormalities  Assessment & Plan Arthritis of right acromioclavicular joint Ongoing right shoulder pain with positive crossover test, tenderness to the [facility] joint, most pain isolated to the [facility] joint.  Has improved strength and range of motion with PT - ongoing pain-would be excellent candidate for ultrasound-guided AC joint injection  Plan: -Diagnosis and treatment discussed in detail.  X-rays reviewed today as noted above -Would be candidate for ultrasound-guided AC joint injection.  Risk and benefits reviewed.  She is a diabetic, she is well-controlled last A1c was 5.8 05/01/2023.  Was advised may have temporary elevations of blood sugars over the next few days.  She  should reach out if she has persistent or severe elevations.  Please see procedure note below -She will continue with physical therapy as she is doing -Recommended Voltaren gel to the area every 6 hours as needed -Heat or ice as needed -Follow-up 6 weeks for reevaluation, sooner if needed.  Consider trial of subacromial injection if [facility] joint pain is improved but still has pain in the  shoulder.  PROCEDURE: Risks & benefits of RT shoulder u/s guided AC joint injection reviewed. Consent obtained. Time-out completed. Patient prepped and draped in the normal fashion. Musculoskeletal ultrasound used to identify appropriate anatomy. U/S exam showing spurring and degenerative changes of the [facility] joint, positive geyser sign.  No increased Doppler flow.   After identifying appropriate anatomy, patient positioned & area cleansed with chlorhexidine. Ethyl chloride spray used to anesthetize the skin. After ensuring adequate anesthesia a solution of 1 mL 1% lidocaine with 1 mL methylprednisolone (Depo-medrol) 40mg /mL injected into the RT [facility] joint using a 22-gauge 1.5-inch needle via anterior approach under ultrasound guidance. Needle well-visualized in the joint. Images saved. Patient tolerated procedure well without any complications. Area covered with adhesive bandage. Post-procedure care reviewed. All questions answered.  Type 2 diabetes mellitus without complication, without long-term current use of insulin (HCC) Well-controlled diabetes with A1c 5.8 on 05/01/2023  Plan: -For ultrasound-guided [facility] joint injection today.Was advised may have temporary elevations of blood sugars over the next few days.  She should reach out if she has persistent or severe elevations.  Please see procedure note below -She should continue to check her blood sugars regularly and continue diabetes regimen per PCP   Patient expressed understanding & agreement with above.  Encounter Diagnoses  Name Primary?   Arthritis of right acromioclavicular joint Yes   Type 2 diabetes mellitus without complication, without long-term current use of insulin (HCC)     Orders Placed This Encounter  Procedures   [place] LIMITED JOINT SPACE STRUCTURES UP RIGHT

## 2023-05-16 ENCOUNTER — Ambulatory Visit: Payer: BC Managed Care – PPO | Attending: Sports Medicine

## 2023-05-16 ENCOUNTER — Other Ambulatory Visit: Payer: Self-pay

## 2023-05-16 DIAGNOSIS — G8929 Other chronic pain: Secondary | ICD-10-CM | POA: Insufficient documentation

## 2023-05-16 DIAGNOSIS — M25511 Pain in right shoulder: Secondary | ICD-10-CM | POA: Insufficient documentation

## 2023-05-16 DIAGNOSIS — M25611 Stiffness of right shoulder, not elsewhere classified: Secondary | ICD-10-CM | POA: Insufficient documentation

## 2023-05-16 DIAGNOSIS — M67911 Unspecified disorder of synovium and tendon, right shoulder: Secondary | ICD-10-CM | POA: Insufficient documentation

## 2023-05-16 NOTE — Therapy (Signed)
 OUTPATIENT PHYSICAL THERAPY SHOULDER TREATMENT   Patient Name: Misty Salazar MRN: [id number] DOB:May 16, 1980, 43 y.o., female Today's Date: 05/16/2023  END OF SESSION:  PT End of Session - 05/16/23 1229     Visit Number 5    Date for PT Re-Evaluation 05/16/23    PT Start Time 0850    PT Stop Time 0932    PT Time Calculation (min) 42 min    Activity Tolerance Patient tolerated treatment well    Behavior During Therapy [facility] for tasks assessed/performed                Past Medical History:  Diagnosis Date   Allergy    Anemia    COVID-19 2022   Diabetes mellitus without complication (HCC)    Hypertension    Hyperthyroidism    Thyroid disease    hyperthyroidism   Past Surgical History:  Procedure Laterality Date   CHOLECYSTECTOMY  01/20/2011   Procedure: LAPAROSCOPIC CHOLECYSTECTOMY;  Surgeon: Rulon Abide, DO;  Location: [facility] ORS;  Service: General;  Laterality: N/A;   DILATATION & CURETTAGE/HYSTEROSCOPY WITH MYOSURE N/A 09/21/2021   Procedure: DILATATION & CURETTAGE/HYSTEROSCOPY WITH MYOSURE;  Surgeon: Jaymes Graff, MD;  Location: [facility] OR;  Service: Gynecology;  Laterality: N/A;  rep will be here confirmed on 09/13/21 CS   KNEE SURGERY  08/2007, 10/2009   right knee- remove fibroma   Patient Active Problem List   Diagnosis Date Noted   Essential hypertension 01/04/2023   Deficiency anemia 05/26/2022   History of uterine fibroid 04/29/2022   Obesity, Class III, BMI 40-49.9 (morbid obesity) (HCC) 09/04/2017   Type 2 diabetes mellitus without complications (HCC) 04/12/2016   MONONUCLEOSIS 05/21/2009   IRON DEFICIENCY ANEMIA, HX OF 05/21/2009    PCP: Evelena Peat, MD  REFERRING PROVIDER: Rich Brave, MD  REFERRING DIAG: Chronic R shoulder pain, ACJ arthritis on XR and POCUS, weakness of RTC; evaluate/treat with hopes of being able to bowl again, may benefit from HEP   THERAPY DIAG:  Dysfunction of right rotator cuff  Chronic right shoulder  pain  Stiffness of right shoulder, not elsewhere classified  Rationale for Evaluation and Treatment: Rehabilitation  ONSET DATE: July 2024   SUBJECTIVE:                                                                                                                                                                                      SUBJECTIVE STATEMENT   05/16/23:  injection with Dr. Margaretha Sheffield really helped, not in pain now, I just want to know how to manage moving forward. Not bowling yet, have a smaller ball that I can practice with Hand dominance: Right  PERTINENT HISTORY: R shoulder injury, has tried various types of medications, saw sports med MD and was referred to PT  PAIN:  Are you having pain? Yes: NPRS scale: 7/10 Pain location: R superior /ant shoulder Pain description: ache, deep Aggravating factors: sleeping on that side, bowling, overuse Relieving factors: meds help  PRECAUTIONS: None  RED FLAGS: None   WEIGHT BEARING RESTRICTIONS: No  FALLS:  Has patient fallen in last 6 months? 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  DIAGNOSTIC FINDINGS:  OA R AC jt  PATIENT SURVEYS:  Quick Dash QuickDASH Score: 25.0 / 100 = 25.0 %  COGNITION: Overall cognitive status: Within functional limits for tasks assessed     SENSATION: WFL  POSTURE: R shoulder protracted, humeral head sitting forward in glenoid  UPPER EXTREMITY ROM:   Active ROM Right eval Left eval R 04/04/23 R 04/18/23 R3/25/25  Shoulder flexion 130 160 >140 155 155  Shoulder extension       Shoulder  abduction    WFL - pain mid range wfl  Shoulder adduction       Shoulder internal rotation    FIR - mildly limited (uncomfortable) wfl  Shoulder external rotation     wfl  Elbow flexion       Elbow extension       Wrist flexion       Wrist extension       Wrist ulnar deviation       Wrist radial deviation       Wrist pronation       Wrist supination       (Blank rows = not tested)  UPPER EXTREMITY MMT:  MMT Right eval Left eval R 04/18/23 L 04/18/23 R3/25/25  Shoulder flexion 4 wfl 4+ 5 5  Shoulder extension 4-  4 4+ 5  Shoulder abduction   4+ 5 5  Shoulder adduction       Shoulder internal rotation 4 wfl 4+ 5 5  Shoulder external rotation 4+ 4 4+ 4+ 5  Middle trapezius 3+ 4- 4 4 5   Lower trapezius   3 3+ 4  Elbow flexion       Elbow extension       Wrist flexion       Wrist extension       Wrist ulnar deviation       Wrist radial deviation       Wrist pronation       Wrist supination       Grip strength (lbs)       (Blank rows = not tested)  SHOULDER SPECIAL TESTS: Impingement tests: Neer impingement test: positive  and Hawkins/Kennedy impingement test: positive   Rotator cuff assessment: External rotation lag sign: negative and Belly press test: negative   JOINT MOBILITY TESTING:  Normal mobility R GH jt for AP glides, PA glides, Inf glides  PALPATION:  Quite Tender over long and short head of biceps,mild over R supraspinatus, very tender subscapularis  TREATMENT DATE:  05/16/23:  Therapeutic activity: Extensive education regarding anatomy of shoulder and AC jt arthritis, with instruction in movements which increase AC jt compression We reviewed and progressed her exercise routine after dc, she is going to the gym regularly and utilizes free weights and some of the machines for arm and leg strength: education to incorporate deep rotator cuff  motor control and coordination training, in order to effectively build her strength in her larger muscle groups, deltoids, lats, biceps, triceps.    Reviewed using closed chain stability training as well as rhythmic stabilization to train her shoulder musculature for multiplanar and  speed challenges:   Instructed in utilizing the machines at the gym for  chest press and pec flys, to avoid excessive shoulder extension and movement back past her trunk to avoid over stretching long head of biceps  For rhythmic stab:   Instructed in cw and ccw spinning a timer on lanyard, with R arm at 90 flexion and against trunk Also instructed in utilizing body blade, B forward flexion with palms up and with R hand only, elbow 90 degrees flexion for side to side engagement, advised to start with 15 to 30 sec and progressing to 60 sec holds  Instructed also in quadriped bird dogs, emphasis on maintaining level, flat pelvis. Patient noted difficulty with L hip ext and R shoulder flex.    Advised to continue utilizing the fast B shoulder ER with arms against trunk as well. Advised to utilize one or all of the rhythmic stab ex prior to lifting wts arms to achieve appropriate recruitment of rotator cuff  Also provided with info regarding shoulder braces similar to the kinesiotaping.    05/02/23: Manual: prone for PA stretching, glides mid thoracic region Trigger Point Dry Needling  Subsequent Treatment: Instructions provided previously at initial dry needling treatment.   Patient Verbal Consent Given: Yes Education Handout Provided: Previously Provided Muscles Treated: R upper traps, R infraspinatus Electrical Stimulation Performed: No Treatment Response/Outcome: decreased tissue resistance Kinesiotaping, 2 I pieces, extending from lat and ant delts to upper and middle traps, 35% pull to relieve sx.  Therex:  pt already strengthening at gym, added/adapted her current routine as follows:  3 way biceps, 10#  palm up and with forearm neutral, 5# palm down 5# kettlebell swings, For/ back, B hands on kettle bell, cues to engage B knees, hips Triceps kick backs with elbow bent 5# Triceps ext with elbow extended 5#   04/18/23 THERAPEUTIC EXERCISE: To improve strength and endurance.  Demonstration, verbal and tactile cues throughout for technique.  UBE: L1.0 x 6 min (3' each fwd and back) Standing B lower trap setting at wall (V wall slide + slight lift-off) 10 x 3"  MANUAL THERAPY: To promote normalized muscle tension, improved flexibility, improved joint mobility, increased ROM, and reduced pain. Trigger Point Dry Needling: Treatment instructions/education: Subsequent Treatment: Instructions provided previously at initial dry needling treatment.  Education Handout Provided: Previously Provided Consent: Patient Verbal Consent Given: Yes Treatment: Muscles Treated: R anterior deltoid & pecs Skilled palpation and monitoring of soft tissue during DN Electrical Stimulation Performed: No Treatment Response/Outcome: Twitch Response Elicited, Palpable Increase in Muscle Length, Decreased TTP, and Improved Exercise Tolerance STM/DTM, manual TPR and pin & stretch to muscles addressed with DN   04/04/23:  Reassessed R shoulder AROM:  Flexion greater than 140 today  Reassessed MMT R shoulder: R shoulder flexion 4+/5, IR 4+/5, ER wnl R biceps 4+5 Manual: Pt with thickened and tender R biceps  muscle belly and long head biceps, so utilized cross friction massage , as well as TPDN: Trigger Point Dry Needling  Initial Treatment: Pt instructed on Dry Needling rational, procedures, and possible side effects. Pt instructed to expect mild to moderate muscle soreness later in the day and/or into the next day.  Pt instructed in methods to reduce muscle soreness. Pt instructed to continue prescribed HEP. Because Dry Needling was performed over or adjacent to a lung field, pt was educated on S/S of pneumothorax  and to seek immediate medical attention should they occur.  Patient was educated on signs and symptoms of infection and other risk factors and advised to seek medical attention should they occur.  Patient verbalized understanding of these instructions and education.   Patient Verbal Consent Given: Yes Education Handout Provided: Yes Muscles Treated: R biceps mid portion and proximal attachment Treatment Response/Outcome: twitch response and elongation of muscle tissue  Supine for inf glides R GH jt 3 bouts , to improve positioning of humeral head in acetabulum.  Kinesiotaping, 2 I pieces extending from lat deltoid to upper traps and ant deltoid to middle traps  Instructed in isometrics for R shoulder flexion/ant delts, as well as long head of biceps, with R elbow extended in door frame.  Also instructed in PNF pattern lowering from flex, add, IR with 2 # wt in standing, with passive assistance from L arm to lift, and eccentric lowering.    Emphasis on pain free movement    03/21/23 Evaluation, education regarding anatomy, mechanics of shoulder.  Kinesiotaping R shoulder 2 I pieces to achieve support of R biceps long head as well as R supraspinatus Instructed in therex to engage R middle traps and R shoulder extensors, advised to perform high reps and daily exercise as these muscles are endurance muscles.   PATIENT EDUCATION: Education details: HEP review and HEP update - lower trap setting   Person educated: Patient Education method: Explanation, Demonstration, Tactile cues, Verbal cues, and Handouts Education comprehension: verbalized understanding, returned demonstration, and verbal cues required  HOME EXERCISE PROGRAM: Access Code: [email or web address] URL: [email or web address] Date: 05/16/2023 Prepared by: Caralee Ates  Exercises - Bird Dog on [employer]  - 1 x daily - 7 x weekly - 3 sets - 10 reps - Bird Dog  - 1 x daily - 7 x weekly - 3 sets - 10 reps Inst in prone rows  with ER Prone shoulder extension, provided with printed copies from med bridge B lower trap setting at wall (V wall slide + slight lift-off)  ASSESSMENT:  CLINICAL IMPRESSION: Jozey has marked improvement in her R shoulder pain following injection with Dr. Margaretha Sheffield.  Today was her final visit with skilled PT. We  spent most of todays session going over long term management of her R shoulder , and hopefully prevention of further injury.  She has a good comprehension of s/s to pay attention to and how to progress with her ongoing strengthening and motor control of R shoulder.   OBJECTIVE IMPAIRMENTS: decreased ROM, decreased strength, hypomobility, impaired flexibility, impaired UE functional use, and pain.   ACTIVITY LIMITATIONS: carrying, lifting, sleeping, and reach over head  PARTICIPATION LIMITATIONS: cleaning, laundry, and community activity  PERSONAL FACTORS: Behavior pattern, Fitness, Past/current experiences, Time since onset of injury/illness/exacerbation, and 1-2 comorbidities: DM, HTN, financial restrictions  are also affecting patient's functional outcome.   REHAB POTENTIAL: Good  CLINICAL DECISION MAKING: Evolving/moderate complexity  EVALUATION COMPLEXITY: Moderate   GOALS: Goals reviewed with patient? Yes  SHORT  TERM GOALS: Target date: 2 weeks 04/04/23  I HEP Baseline:initiated at eval Goal status: MET - 04/18/23  LONG TERM GOALS: Target date: 05/16/23  Strength R shoulder for IR improve to 5/5 from 4/5, improve B middle traps strength to 4+/5 from 3+/5 Baseline:  Goal status: IN PROGRESS - 04/18/23 05/02/23: met so far 05/16/23:met  2.  Improve QuickDASH Score: 25.0 / 100 = 25.0 % to  15% or less disability Baseline:  Goal status: 05/16/23: MET QuickDASH Score: 9.1 / 100 = 9.1 % 3.   R shoulder flexion AROM 145 or greater Baseline: 130 Goal status: MET - 04/18/23 05/02/23: met so far 05/16/23 met 4.  Able to bowl competitively without lingering pain R  shoulder Baseline:  Goal status: INITIAL 05/02/23: not met 05/16/23 not met, taking season off to recover   PLAN:  PT FREQUENCY: 2x/month  PT DURATION: 8 weeks  PLANNED INTERVENTIONS: 97110-Therapeutic exercises, 97530- Therapeutic activity, 97112- Neuromuscular re-education, 97535- Self Care, and 16109- Manual therapy  PLAN FOR NEXT SESSION:  DC today   Tianni Escamilla L Remigio Mcmillon, PT, DPT, OCS 05/16/2023, 12:32 PM

## 2023-06-12 DIAGNOSIS — Z6841 Body Mass Index (BMI) 40.0 and over, adult: Secondary | ICD-10-CM | POA: Diagnosis not present

## 2023-06-12 DIAGNOSIS — I10 Essential (primary) hypertension: Secondary | ICD-10-CM | POA: Diagnosis not present

## 2023-06-12 DIAGNOSIS — Z794 Long term (current) use of insulin: Secondary | ICD-10-CM | POA: Diagnosis not present

## 2023-06-12 DIAGNOSIS — E119 Type 2 diabetes mellitus without complications: Secondary | ICD-10-CM | POA: Diagnosis not present

## 2023-06-15 ENCOUNTER — Ambulatory Visit (INDEPENDENT_AMBULATORY_CARE_PROVIDER_SITE_OTHER): Admitting: Family Medicine

## 2023-06-15 ENCOUNTER — Encounter: Payer: Self-pay | Admitting: Family Medicine

## 2023-06-15 VITALS — BP 137/80 | Ht 62.0 in | Wt 305.0 lb

## 2023-06-15 DIAGNOSIS — M19011 Primary osteoarthritis, right shoulder: Secondary | ICD-10-CM | POA: Diagnosis not present

## 2023-06-15 NOTE — Progress Notes (Signed)
 DATE OF VISIT: 06/15/2023        Misty Salazar DOB: Feb 27, 1980 MRN: [id number]  CC:  f/u Rt shoulder  History of present Illness: Misty Salazar is a 43 y.o. female who presents for a follow-up visit for right shoulder pain due to [facility] joint arthritis.  He had ultrasound-guided [facility] joint injection with me 05/04/2023.  She reports she is not 100% pain-free at this time.  Does not use any medications.  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  She did decrease her ball weight from 14 pounds to 12 pounds, but does plan to increase again.  Otherwise doing well.  Medications:  Outpatient Encounter Medications as of 06/15/2023  Medication Sig   amLODipine  (NORVASC ) 10 MG tablet Take 10 mg by mouth daily.   Azelastine  HCl 137 MCG/SPRAY SOLN PLACE 2 SPRAYS INTO BOTH NOSTRILS 2 (TWO) TIMES DAILY. USE IN EACH NOSTRIL AS DIRECTED   cetirizine (ZYRTEC) 10 MG tablet Take 10 mg by mouth daily.   cyclobenzaprine  (FLEXERIL ) 10 MG tablet Take 1 tablet (10 mg total) by mouth 3 (three) times daily as needed for muscle spasms.   ketotifen (ALAWAY) 0.025 % ophthalmic solution Place 1 drop into both eyes daily as needed (allergies).   losartan (COZAAR) 25 MG tablet Take 25 mg by mouth daily.   Multiple Vitamin (MULTIVITAMIN) tablet Take 1 tablet by mouth daily.   OZEMPIC, 1 MG/DOSE, 4 MG/3ML SOPN Inject 1 mg into the skin once a week.   No facility-administered encounter medications on file as of 06/15/2023.    Allergies: is allergic to oxycodone-acetaminophen  and percocet [oxycodone-acetaminophen ].  Physical Examination: Vitals: BP 137/80   Ht 5\' 2"  (1.575 m)   Wt (!) 305 lb (138.3 kg)   BMI 55.79 kg/m  GENERAL:  Misty Salazar is a 43 y.o. female appearing their stated age, alert and oriented x 3, in no apparent distress.  SKIN: no rashes or lesions, skin clean, dry, intact MSK: Right shoulder without gross deformity.  Full range of motion without pain.  No tenderness over the [facility]  joint, bicipital groove.  Negative crossover test.  Rotator cuff strength 5/5 throughout Neurovascularly intact distally  Assessment & Plan Arthritis of right acromioclavicular joint AC joint arthritis status post ultrasound-guided CSI 05/04/2023, now pain-free and doing well  Plan: - Can advance activity as tolerated - Should follow-up if symptoms return.  Advised could consider repeat injection after 08/04/2023 if needed. - Follow-up as needed   Patient expressed understanding & agreement with above.  Encounter Diagnosis  Name Primary?   Arthritis of right acromioclavicular joint Yes    No orders of the defined types were placed in this encounter.

## 2023-09-05 ENCOUNTER — Ambulatory Visit: Payer: Self-pay | Admitting: *Deleted

## 2023-09-05 ENCOUNTER — Ambulatory Visit (INDEPENDENT_AMBULATORY_CARE_PROVIDER_SITE_OTHER): Admitting: Family Medicine

## 2023-09-05 VITALS — BP 146/86 | HR 92 | Temp 98.0°F | Wt 308.3 lb

## 2023-09-05 DIAGNOSIS — L02414 Cutaneous abscess of left upper limb: Secondary | ICD-10-CM | POA: Diagnosis not present

## 2023-09-05 NOTE — Progress Notes (Signed)
   Established Patient Office Visit  Subjective   Patient ID: Misty Salazar, female    DOB: 1980-08-28  Age: 43 y.o. MRN: [id number]  Chief Complaint  Patient presents with   Abcess    HPI   [name] is seen with possible abscess left anterior shoulder.  She has had sebaceous cyst noted for at least couple years.  Started having a little bit of swelling recently.  No pain.  Small amount of drainage and leakage.  She noticed some foul odor.  Past Medical History:  Diagnosis Date   Allergy    Anemia    COVID-19 2022   Diabetes mellitus without complication (HCC)    Hypertension    Hyperthyroidism    Thyroid  disease    hyperthyroidism   Past Surgical History:  Procedure Laterality Date   CHOLECYSTECTOMY  01/20/2011   Procedure: LAPAROSCOPIC CHOLECYSTECTOMY;  Surgeon: Redell Alm Faith, DO;  Location: [facility];  Service: General;  Laterality: N/A;   DILATATION & CURETTAGE/HYSTEROSCOPY WITH MYOSURE N/A 09/21/2021   Procedure: DILATATION & CURETTAGE/HYSTEROSCOPY WITH MYOSURE;  Surgeon: Armond Cape, MD;  Location: [facility];  Service: Gynecology;  Laterality: N/A;  rep will be here confirmed on 09/13/21 CS   KNEE SURGERY  08/2007, 10/2009   right knee- remove fibroma    reports that she has never smoked. She has never used smokeless tobacco. She reports current alcohol use. She reports that she does not use drugs. family history includes Cancer in her maternal grandmother and mother; Diabetes in her father and mother; Hyperlipidemia in her father and mother; Hypertension in her brother, father, and mother; Stroke in her maternal grandfather, maternal grandmother, and paternal grandfather. Allergies  Allergen Reactions   Oxycodone-Acetaminophen  Itching and Hives   Percocet [Oxycodone-Acetaminophen ] Hives and Itching    Review of Systems  Constitutional:  Negative for chills and fever.      Objective:     BP (!) 146/86 (BP Location: Left Arm, Patient Position: Sitting, Cuff  Size: Large)   Pulse 92   Temp 98 F (36.7 C) (Oral)   Wt (!) 308 lb 4.8 oz (139.8 kg)   SpO2 97%   BMI 56.39 kg/m    Physical Exam Vitals reviewed.  Constitutional:      General: She is not in acute distress.    Appearance: She is not ill-appearing.  Cardiovascular:     Rate and Rhythm: Normal rate and regular rhythm.  Skin:    Comments: Left anterior shoulder approximately 1 and half to 1-1/2 cm area of fluctuance.  No significant erythema.  Nontender.  Neurological:     Mental Status: She is alert.      No results found for any visits on 09/05/23.    The 10-year ASCVD risk score (Arnett DK, et al., 2019) is: 8.3%    Assessment & Plan:   Abscess sebaceous cyst left anterior shoulder.  Recommended I&D.  Patient aware of low risk of bleeding and scarring.  Skin prepped with Betadine .  Local anesthesia with 1% plain Xylocaine .  Using #11 blade made approximately 1 and half centimeter linear incision over area of maximal fluctuance.  Copious drainage of sebaceous cyst contents some of which was purulent.  Pungent odor.  Able to express a large amount of contents of sebaceous cyst including large portions of cyst sac.  Minimal bleeding.  Packing applied.  Outer dressing applied.  Return in 1 day for packing removal and reassessment  Wolm Scarlet, MD

## 2023-09-05 NOTE — Telephone Encounter (Signed)
 FYI Only or Action Required?: FYI only for provider.  Patient was last seen in primary care on 05/01/2023 by Micheal Wolm ORN, MD.  Called Nurse Triage reporting Cyst.  Symptoms began several days ago.  Interventions attempted: Nothing.  Symptoms are: gradually worsening.  Triage Disposition: See PCP When Office is Open (Within 3 Days)  Patient/caregiver understands and will follow disposition?: yes    Reason for Disposition  Boil > 1/2 inch across (> 12 mm; larger than a marble)  Answer Assessment - Initial Assessment Questions 1. APPEARANCE of BOIL: What does the boil look like?      Cyst present on shoulder- patient noticed smell- unable to see visible opening- but there is fluid 2. LOCATION: Where is the boil located?      left 3. NUMBER: How many boils are there?      one 4. SIZE: How big is the boil? (e.g., inches, cm; compare to size of a coin or other object)     Quarter size 5. ONSET: When did the boil start?     2 years 6. PAIN: Is there any pain? If Yes, ask: How bad is the pain?   (Scale 1-10; or mild, moderate, severe)     no 7. FEVER: Do you have a fever? If Yes, ask: What is it, how was it measured, and when did it start?      no 8. SOURCE: Have you been around anyone with boils or other Staph infections? Have you ever had boils before?     unsure  Protocols used: Boil (Skin Abscess)-A-AH   Copied from CRM [id number]. Topic: Clinical - Red Word Triage >> Sep 05, 2023  9:04 AM Mia F wrote: Red Word that prompted transfer to Nurse Triage: Cyst on shoulder. Pt says she seen Dr Micheal for it before and was told that if it doesn't bother her it is ok. Pt says now that it is leaking and the leakage has a smell to it. No other symptoms.

## 2023-09-06 ENCOUNTER — Encounter: Payer: Self-pay | Admitting: Family Medicine

## 2023-09-06 ENCOUNTER — Ambulatory Visit (INDEPENDENT_AMBULATORY_CARE_PROVIDER_SITE_OTHER): Admitting: Family Medicine

## 2023-09-06 VITALS — BP 134/86 | HR 93 | Temp 97.5°F | Wt 309.0 lb

## 2023-09-06 DIAGNOSIS — L02414 Cutaneous abscess of left upper limb: Secondary | ICD-10-CM

## 2023-09-06 NOTE — Patient Instructions (Signed)
 Clean daily with soap and water  Keep covered until skin heals over  Follow up for any redness, swelling, or other concerns.

## 2023-09-06 NOTE — Progress Notes (Signed)
   Established Patient Office Visit  Subjective   Patient ID: Misty Salazar, female    DOB: Feb 11, 1980  Age: 43 y.o. MRN: [id number]  Chief Complaint  Patient presents with   Medical Management of Chronic Issues    HPI   Misty Salazar is here today for follow-up regarding I&D abscess left anterior shoulder.  A little bit of soreness and little bit of drainage but no other concerns.  Here for packing removal and reassessment today.  Past Medical History:  Diagnosis Date   Allergy    Anemia    COVID-19 2022   Diabetes mellitus without complication (HCC)    Hypertension    Hyperthyroidism    Thyroid  disease    hyperthyroidism   Past Surgical History:  Procedure Laterality Date   CHOLECYSTECTOMY  01/20/2011   Procedure: LAPAROSCOPIC CHOLECYSTECTOMY;  Surgeon: Redell Alm Faith, DO;  Location: [facility];  Service: General;  Laterality: N/A;   DILATATION & CURETTAGE/HYSTEROSCOPY WITH MYOSURE N/A 09/21/2021   Procedure: DILATATION & CURETTAGE/HYSTEROSCOPY WITH MYOSURE;  Surgeon: Armond Cape, MD;  Location: [facility];  Service: Gynecology;  Laterality: N/A;  rep will be here confirmed on 09/13/21 CS   KNEE SURGERY  08/2007, 10/2009   right knee- remove fibroma    reports that Misty Salazar has never smoked. Misty Salazar has never used smokeless tobacco. Misty Salazar reports current alcohol use. Misty Salazar reports that Misty Salazar does not use drugs. family history includes Cancer in Misty Salazar maternal grandmother and mother; Diabetes in Misty Salazar father and mother; Hyperlipidemia in Misty Salazar father and mother; Hypertension in Misty Salazar brother, father, and mother; Stroke in Misty Salazar maternal grandfather, maternal grandmother, and paternal grandfather. Allergies  Allergen Reactions   Oxycodone-Acetaminophen  Itching and Hives   Percocet [Oxycodone-Acetaminophen ] Hives and Itching    Review of Systems  Constitutional:  Negative for chills and fever.      Objective:     BP 134/86 (BP Location: Left Arm, Patient Position: Sitting, Cuff Size: Large)    Pulse 93   Temp (!) 97.5 F (36.4 C) (Oral)   Wt (!) 309 lb (140.2 kg)   SpO2 96%   BMI 56.52 kg/m    Physical Exam Vitals reviewed.  Constitutional:      General: Misty Salazar is not in acute distress.    Appearance: Misty Salazar is not ill-appearing.  Skin:    Comments: Packing removed from left anterior shoulder wound.  No surrounding erythema.  No swelling.  Applied some pressure and we were able to express few further remnants of sebaceous cyst sac.  Wound cavity was irrigated with hydroperoxide.  No visible purulence  Neurological:     Mental Status: Misty Salazar is alert.      No results found for any visits on 09/06/23.    The 10-year ASCVD risk score (Arnett DK, et al., 2019) is: 5.4%    Assessment & Plan:   Abscess left anterior shoulder.  Packing removed.  Overall looks much better.  No cellulitis changes.  No need for repacking at this time.  Irrigated with hydroperoxide.  Covered with bandage.  Clean daily with soap and water.  Follow-up for any recurrent erythema or other concerns.  Patient aware that sebaceous cysts can regrow with any remnant of sac left.  Wolm Scarlet, MD

## 2023-11-25 ENCOUNTER — Other Ambulatory Visit: Payer: Self-pay | Admitting: Family Medicine

## 2023-12-11 DIAGNOSIS — Z794 Long term (current) use of insulin: Secondary | ICD-10-CM | POA: Diagnosis not present

## 2023-12-11 DIAGNOSIS — E119 Type 2 diabetes mellitus without complications: Secondary | ICD-10-CM | POA: Diagnosis not present

## 2023-12-11 DIAGNOSIS — E782 Mixed hyperlipidemia: Secondary | ICD-10-CM | POA: Diagnosis not present

## 2023-12-11 DIAGNOSIS — I10 Essential (primary) hypertension: Secondary | ICD-10-CM | POA: Diagnosis not present

## 2023-12-22 DIAGNOSIS — I10 Essential (primary) hypertension: Secondary | ICD-10-CM | POA: Diagnosis not present

## 2023-12-22 DIAGNOSIS — E782 Mixed hyperlipidemia: Secondary | ICD-10-CM | POA: Diagnosis not present

## 2023-12-22 DIAGNOSIS — Z794 Long term (current) use of insulin: Secondary | ICD-10-CM | POA: Diagnosis not present

## 2024-01-01 DIAGNOSIS — F4320 Adjustment disorder, unspecified: Secondary | ICD-10-CM | POA: Diagnosis not present

## 2024-01-15 DIAGNOSIS — F4320 Adjustment disorder, unspecified: Secondary | ICD-10-CM | POA: Diagnosis not present

## 2024-01-29 DIAGNOSIS — F4320 Adjustment disorder, unspecified: Secondary | ICD-10-CM | POA: Diagnosis not present

## 2024-02-25 ENCOUNTER — Other Ambulatory Visit: Payer: Self-pay | Admitting: Family Medicine

## 2024-02-26 LAB — HM DIABETES EYE EXAM

## 2024-02-26 MED ORDER — AZELASTINE HCL 137 MCG/SPRAY NA SOLN: NASAL | 2 refills | Status: AC

## 2024-02-26 NOTE — Addendum Note (Signed)
 Addended by: METTA KRISTEN CROME on: 02/26/2024 08:14 AM   Modules accepted: Orders

## 2024-03-07 ENCOUNTER — Encounter: Payer: Self-pay | Admitting: Family Medicine
